# Patient Record
Sex: Male | Born: 1935
Health system: Southern US, Community
[De-identification: ages and names within clinical notes are randomized; demographics above are authoritative.]

## PROBLEM LIST (undated history)

## (undated) DIAGNOSIS — D126 Benign neoplasm of colon, unspecified: Secondary | ICD-10-CM

## (undated) DIAGNOSIS — E230 Hypopituitarism: Secondary | ICD-10-CM

## (undated) DIAGNOSIS — K219 Gastro-esophageal reflux disease without esophagitis: Secondary | ICD-10-CM

## (undated) DIAGNOSIS — I10 Essential (primary) hypertension: Secondary | ICD-10-CM

## (undated) DIAGNOSIS — Z8719 Personal history of other diseases of the digestive system: Secondary | ICD-10-CM

## (undated) DIAGNOSIS — E039 Hypothyroidism, unspecified: Secondary | ICD-10-CM

## (undated) DIAGNOSIS — A0472 Enterocolitis due to Clostridium difficile, not specified as recurrent: Secondary | ICD-10-CM

## (undated) DIAGNOSIS — A048 Other specified bacterial intestinal infections: Secondary | ICD-10-CM

## (undated) DIAGNOSIS — Z8619 Personal history of other infectious and parasitic diseases: Secondary | ICD-10-CM

## (undated) DIAGNOSIS — H5347 Heteronymous bilateral field defects: Secondary | ICD-10-CM

## (undated) DIAGNOSIS — Z789 Other specified health status: Secondary | ICD-10-CM

## (undated) DIAGNOSIS — D369 Benign neoplasm, unspecified site: Secondary | ICD-10-CM

## (undated) HISTORY — DX: Other specified health status: Z78.9

## (undated) HISTORY — DX: Enterocolitis due to Clostridium difficile, not specified as recurrent: A04.72

## (undated) HISTORY — DX: Essential (primary) hypertension: I10

## (undated) HISTORY — DX: Gastro-esophageal reflux disease without esophagitis: K21.9

## (undated) HISTORY — DX: Benign neoplasm, unspecified site: D36.9

## (undated) HISTORY — PX: OTHER SURGICAL HISTORY: SHX169

## (undated) HISTORY — DX: Hypopituitarism: E23.0

## (undated) HISTORY — PX: CATARACT EXTRACTION, BILATERAL: SHX1313

## (undated) HISTORY — DX: Personal history of other diseases of the digestive system: Z87.19

## (undated) HISTORY — DX: Benign neoplasm of colon, unspecified: D12.6

## (undated) HISTORY — DX: Heteronymous bilateral field defects: H53.47

## (undated) HISTORY — PX: TONSILLECTOMY: SUR1361

## (undated) HISTORY — DX: Other specified bacterial intestinal infections: A04.8

## (undated) HISTORY — DX: Personal history of other infectious and parasitic diseases: Z86.19

## (undated) HISTORY — DX: Hypothyroidism, unspecified: E03.9

---

## 2004-03-26 DIAGNOSIS — D126 Benign neoplasm of colon, unspecified: Secondary | ICD-10-CM

## 2004-03-26 HISTORY — DX: Benign neoplasm of colon, unspecified: D12.6

## 2004-03-26 HISTORY — PX: COLONOSCOPY: SHX174

## 2005-03-26 HISTORY — PX: HAND SURGERY: SHX662

## 2005-08-14 ENCOUNTER — Ambulatory Visit: Payer: Self-pay | Admitting: Internal Medicine

## 2005-08-27 ENCOUNTER — Ambulatory Visit (HOSPITAL_COMMUNITY): Admission: RE | Admit: 2005-08-27 | Discharge: 2005-08-27 | Payer: Self-pay | Admitting: Internal Medicine

## 2005-09-21 ENCOUNTER — Ambulatory Visit: Payer: Self-pay | Admitting: Internal Medicine

## 2005-09-21 ENCOUNTER — Ambulatory Visit (HOSPITAL_COMMUNITY): Admission: RE | Admit: 2005-09-21 | Discharge: 2005-09-21 | Payer: Self-pay | Admitting: Internal Medicine

## 2005-09-21 HISTORY — PX: ESOPHAGOGASTRODUODENOSCOPY: SHX1529

## 2005-12-12 ENCOUNTER — Encounter (INDEPENDENT_AMBULATORY_CARE_PROVIDER_SITE_OTHER): Payer: Self-pay | Admitting: *Deleted

## 2005-12-12 ENCOUNTER — Ambulatory Visit (HOSPITAL_BASED_OUTPATIENT_CLINIC_OR_DEPARTMENT_OTHER): Admission: RE | Admit: 2005-12-12 | Discharge: 2005-12-12 | Payer: Self-pay | Admitting: Orthopedic Surgery

## 2009-10-02 ENCOUNTER — Inpatient Hospital Stay (HOSPITAL_COMMUNITY): Admission: EM | Admit: 2009-10-02 | Discharge: 2009-10-05 | Payer: Self-pay | Admitting: Internal Medicine

## 2009-10-02 ENCOUNTER — Ambulatory Visit: Payer: Self-pay | Admitting: Internal Medicine

## 2009-10-04 ENCOUNTER — Ambulatory Visit: Payer: Self-pay | Admitting: Internal Medicine

## 2009-10-13 ENCOUNTER — Encounter: Payer: Self-pay | Admitting: Internal Medicine

## 2009-10-21 ENCOUNTER — Telehealth (INDEPENDENT_AMBULATORY_CARE_PROVIDER_SITE_OTHER): Payer: Self-pay | Admitting: *Deleted

## 2009-10-21 ENCOUNTER — Encounter (INDEPENDENT_AMBULATORY_CARE_PROVIDER_SITE_OTHER): Payer: Self-pay | Admitting: *Deleted

## 2009-10-21 ENCOUNTER — Encounter: Payer: Self-pay | Admitting: Internal Medicine

## 2009-10-31 ENCOUNTER — Ambulatory Visit (HOSPITAL_COMMUNITY): Admission: RE | Admit: 2009-10-31 | Discharge: 2009-10-31 | Payer: Self-pay | Admitting: Internal Medicine

## 2009-11-04 ENCOUNTER — Ambulatory Visit: Payer: Self-pay | Admitting: Internal Medicine

## 2009-11-04 DIAGNOSIS — Z8719 Personal history of other diseases of the digestive system: Secondary | ICD-10-CM

## 2009-11-21 ENCOUNTER — Encounter (INDEPENDENT_AMBULATORY_CARE_PROVIDER_SITE_OTHER): Payer: Self-pay | Admitting: *Deleted

## 2009-11-22 ENCOUNTER — Encounter: Payer: Self-pay | Admitting: Internal Medicine

## 2010-03-26 DIAGNOSIS — D369 Benign neoplasm, unspecified site: Secondary | ICD-10-CM

## 2010-03-26 HISTORY — DX: Benign neoplasm, unspecified site: D36.9

## 2010-03-28 ENCOUNTER — Inpatient Hospital Stay (HOSPITAL_COMMUNITY): Admission: EM | Admit: 2010-03-28 | Discharge: 2010-03-31 | Payer: Self-pay | Source: Home / Self Care

## 2010-03-29 LAB — DIFFERENTIAL
Basophils Absolute: 0 10*3/uL (ref 0.0–0.1)
Basophils Relative: 0 % (ref 0–1)
Eosinophils Absolute: 0 10*3/uL (ref 0.0–0.7)
Eosinophils Relative: 0 % (ref 0–5)
Lymphocytes Relative: 24 % (ref 12–46)
Lymphs Abs: 1 10*3/uL (ref 0.7–4.0)
Monocytes Absolute: 0.2 10*3/uL (ref 0.1–1.0)
Monocytes Relative: 6 % (ref 3–12)
Neutro Abs: 2.8 10*3/uL (ref 1.7–7.7)
Neutrophils Relative %: 68 % (ref 43–77)

## 2010-03-29 LAB — MAGNESIUM: Magnesium: 2.1 mg/dL (ref 1.5–2.5)

## 2010-03-29 LAB — CBC
HCT: 48.5 % (ref 39.0–52.0)
Hemoglobin: 17.1 g/dL — ABNORMAL HIGH (ref 13.0–17.0)
MCH: 31.4 pg (ref 26.0–34.0)
MCHC: 35.3 g/dL (ref 30.0–36.0)
MCV: 89 fL (ref 78.0–100.0)
Platelets: 185 10*3/uL (ref 150–400)
RBC: 5.45 MIL/uL (ref 4.22–5.81)
RDW: 14.1 % (ref 11.5–15.5)
WBC: 4.2 10*3/uL (ref 4.0–10.5)

## 2010-03-29 LAB — T4, FREE: Free T4: 1.24 ng/dL (ref 0.80–1.80)

## 2010-03-29 LAB — TSH: TSH: 0.056 u[IU]/mL — ABNORMAL LOW (ref 0.350–4.500)

## 2010-03-29 LAB — BASIC METABOLIC PANEL
BUN: 29 mg/dL — ABNORMAL HIGH (ref 6–23)
CO2: 23 mEq/L (ref 19–32)
Calcium: 8.4 mg/dL (ref 8.4–10.5)
Chloride: 106 mEq/L (ref 96–112)
Creatinine, Ser: 1.46 mg/dL (ref 0.4–1.5)
GFR calc Af Amer: 57 mL/min — ABNORMAL LOW (ref >60)
GFR calc non Af Amer: 47 mL/min — ABNORMAL LOW (ref >60)
Glucose, Bld: 134 mg/dL — ABNORMAL HIGH (ref 70–99)
Potassium: 4.3 mEq/L (ref 3.5–5.1)
Sodium: 141 mEq/L (ref 135–145)

## 2010-03-29 LAB — T3, FREE: T3, Free: 1.6 pg/mL — ABNORMAL LOW (ref 2.3–4.2)

## 2010-03-29 LAB — PHOSPHORUS: Phosphorus: 3.7 mg/dL (ref 2.3–4.6)

## 2010-03-30 LAB — HEMOCCULT GUIAC POC 1CARD (OFFICE): Fecal Occult Bld: NEGATIVE

## 2010-03-30 LAB — BASIC METABOLIC PANEL
BUN: 26 mg/dL — ABNORMAL HIGH (ref 6–23)
CO2: 24 mEq/L (ref 19–32)
Calcium: 8.1 mg/dL — ABNORMAL LOW (ref 8.4–10.5)
Chloride: 109 mEq/L (ref 96–112)
Creatinine, Ser: 1.4 mg/dL (ref 0.4–1.5)
GFR calc Af Amer: 60 mL/min — ABNORMAL LOW (ref >60)
GFR calc non Af Amer: 49 mL/min — ABNORMAL LOW (ref >60)
Glucose, Bld: 109 mg/dL — ABNORMAL HIGH (ref 70–99)
Potassium: 4.3 mEq/L (ref 3.5–5.1)
Sodium: 141 mEq/L (ref 135–145)

## 2010-03-30 LAB — CBC
HCT: 42.4 % (ref 39.0–52.0)
Hemoglobin: 14.4 g/dL (ref 13.0–17.0)
MCH: 30.1 pg (ref 26.0–34.0)
MCHC: 34 g/dL (ref 30.0–36.0)
MCV: 88.7 fL (ref 78.0–100.0)
Platelets: 187 10*3/uL (ref 150–400)
RBC: 4.78 MIL/uL (ref 4.22–5.81)
RDW: 13.7 % (ref 11.5–15.5)
WBC: 5.8 10*3/uL (ref 4.0–10.5)

## 2010-03-30 LAB — DIFFERENTIAL
Basophils Absolute: 0 10*3/uL (ref 0.0–0.1)
Basophils Relative: 0 % (ref 0–1)
Eosinophils Absolute: 0 10*3/uL (ref 0.0–0.7)
Eosinophils Relative: 0 % (ref 0–5)
Lymphocytes Relative: 26 % (ref 12–46)
Lymphs Abs: 1.5 10*3/uL (ref 0.7–4.0)
Monocytes Absolute: 0.9 10*3/uL (ref 0.1–1.0)
Monocytes Relative: 16 % — ABNORMAL HIGH (ref 3–12)
Neutro Abs: 3.4 10*3/uL (ref 1.7–7.7)
Neutrophils Relative %: 58 % (ref 43–77)

## 2010-03-31 LAB — BASIC METABOLIC PANEL
BUN: 16 mg/dL (ref 6–23)
CO2: 25 mEq/L (ref 19–32)
Calcium: 7.7 mg/dL — ABNORMAL LOW (ref 8.4–10.5)
Chloride: 113 mEq/L — ABNORMAL HIGH (ref 96–112)
Creatinine, Ser: 1.26 mg/dL (ref 0.4–1.5)
GFR calc Af Amer: >60 mL/min (ref >60)
GFR calc non Af Amer: 56 mL/min — ABNORMAL LOW (ref >60)
Glucose, Bld: 99 mg/dL (ref 70–99)
Potassium: 4 mEq/L (ref 3.5–5.1)
Sodium: 142 mEq/L (ref 135–145)

## 2010-04-10 ENCOUNTER — Encounter: Payer: Self-pay | Admitting: Internal Medicine

## 2010-04-10 ENCOUNTER — Ambulatory Visit
Admission: RE | Admit: 2010-04-10 | Discharge: 2010-04-10 | Payer: Self-pay | Source: Home / Self Care | Attending: Urgent Care | Admitting: Urgent Care

## 2010-04-10 DIAGNOSIS — Z860100 Personal history of colon polyps, unspecified: Secondary | ICD-10-CM | POA: Insufficient documentation

## 2010-04-18 ENCOUNTER — Ambulatory Visit (HOSPITAL_COMMUNITY)
Admission: RE | Admit: 2010-04-18 | Discharge: 2010-04-18 | Payer: Self-pay | Source: Home / Self Care | Attending: Internal Medicine | Admitting: Internal Medicine

## 2010-04-18 HISTORY — PX: COLONOSCOPY: SHX174

## 2010-04-21 NOTE — H&P (Addendum)
[name] [name], [name] [name]               ACCOUNT NO.:  [id number]  MEDICAL RECORD NO.:  [id number]          PATIENT TYPE:  EMS  LOCATION:  ED                            FACILITY:  [facility]  PHYSICIAN:  Richarda Overlie, MD       DATE OF BIRTH:  1932/09/22  DATE OF ADMISSION:  03/28/2010 DATE OF DISCHARGE:  LH                             HISTORY & PHYSICAL   PRIMARY CARE PHYSICIAN:  Dr. Weyman [place].  CHIEF COMPLAINT:  Nausea.  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  The patient has not been feeling well since Friday.  He developed sniffles, a postnasal drip and a cough and some chest congestion which he attributed to a viral upper respiratory tract infection.  On Saturday, the patient was feeling fine.  Late Sunday night the patient started developing some nausea and for the last couple of days has had recurrent episodes of vomiting and has not been able to keep any food down.  The patient felt that he was getting dehydrated and therefore came to the ED.  He recalls that his last episode of small-bowel obstruction in July 2011 was also preceded by viral gastroenteritis.  The patient denies any blood in his stool, black tarry stool.  His vomitus is clear without any bile or blood.  He has gained about 3-4 pounds over the holidays.  He denies any chest pain or any shortness of breath.  He was doing some traveling last week and visited his family in [place].  He had a colonoscopy 5 years ago.  Of note is that he had an extensive evaluation done in August 2011 with an MR enterography that showed no abnormality to explain the patient's recent bowel obstruction.  PAST MEDICAL HISTORY: 1. History of panhypopituitarism status post surgery in  [facility] in January 2005. 2. Prior partial small-bowel obstruction. 3. History of colon polyps removed in 2006. 4. History of hepatitis B with immunity. 5. History of H. pylori gastritis status post treatment. 6. History of Clostridium difficile in the past. 7. Hypothyroidism. 8. Microscopic hematuria. 9. Hypertension.  ALLERGIES:  NO KNOWN DRUG ALLERGIES.  FAMILY HISTORY:  Mother lived until the age of 26, and father lived to the age of 72.  SOCIAL HISTORY:  He is married and has 2 adopted children.  He is a retired Sports administrator.  Does not smoke or drink.  PHYSICAL EXAMINATION:  VITAL SIGNS:  Blood pressure 98/77, pulse 104, respirations 20, temperature 98.0.  GENERAL:  The patient appears to be alert, oriented, comfortable in no acute cardiopulmonary distress. HEENT:  Pupils are equal and reactive.  Extraocular movements are intact. NECK:  Supple.  No JVD. LUNGS:  Clear to auscultation bilaterally.  No wheezes, crackles or rhonchi. CARDIOVASCULAR:  Regular rate and rhythm.  No murmurs, rubs or gallops. ABDOMEN:  Obese, soft, nontender, but distended with  hypoactive bowel sounds.  EXTREMITIES:  Without any cyanosis, clubbing or edema. NEUROLOGIC:  Cranial nerves II-XII appear to be grossly intact. PSYCHIATRIC:  Appropriate mood and affect. SKIN:  Without any skin rashes.  LABORATORIES: 1. Point-of-care markers show a troponin of less than 0.05. 2. B-Met:  Sodium 138, potassium 4.2, chloride 98, bicarb 28, glucose     108, BUN 29, creatinine 1.67, calcium 9.5. 3. CBC:  WBC 5.5, hemoglobin 18.1, hematocrit 52.5, platelet count of     213. 4. CT scan of the abdomen and pelvis shows small-bowel obstruction     transition point in the right abdomen at the level of the ileum. 5. Abdominal KUB shows mild mid to distal small-bowel obstruction but     no free air. Possible left lower lobe nodule.  ASSESSMENT: 1. Partial small-bowel obstruction. 2. History of  panhypopituitarism. 3. Hypertension, currently low blood pressure.  PLAN:  The patient will be admitted to a regular floor tonight.  He will be hydrated with normal saline.  His creatinine is mildly elevated; therefore, I suspect that he has acute renal insufficiency in the setting of a small-bowel obstruction.  Dr. Roetta Sessions will be consulted in the morning.  He has already been notified by the ED physician.  The patient may need a repeat colonoscopy.  We will start him on stress dose hydrocortisone given his history of panhypopituitarism and adrenal insufficiency.  The patient will also be continued on his testosterone and his Synthroid.  The patient is very comfortable now.  We will avoid narcotics as these has made his symptoms worse in the past.  We will use Zofran p.r.n. for nausea.  He is a full code     Richarda Overlie, MD     NA/MEDQ  D:  03/28/2010  T:  03/28/2010  Job:  [id number]  Electronically Signed by Richarda Overlie MD on 04/21/2010 07:22:17 AM

## 2010-04-21 NOTE — Discharge Summary (Addendum)
[name] [name], [name] [name]               ACCOUNT NO.:  [id number]  MEDICAL RECORD NO.:  [id number]          PATIENT TYPE:  INP  LOCATION:  A303                          FACILITY:  [facility]  PHYSICIAN:  Ramiro Harvest, MD    DATE OF BIRTH:  1933/01/30  DATE OF ADMISSION:  03/28/2010 DATE OF DISCHARGE:  01/06/2012LH                         DISCHARGE SUMMARY-REFERRING   GASTROENTEROLOGIST:  Dr. Jena Gauss.  ENDOCRINOLOGIST:  Dr. Frances Nickels at [facility]  DISCHARGE DIAGNOSES: 1. Small-bowel obstruction, resolved. 2. Dehydration, resolved. 3. Acute renal failure, resolved. 4. Hypothyroidism. 5. History of panhypopituitarism status post surgery at [facility] in January of 2005. 6. Prior partial small-bowel obstruction in July of 2011. 7. Hypertension. 8. History of hepatitis B with immunity. 9. History of Helicobacter pylori status post treatment. 10.History of Clostridium difficile in the past. 11.History of multiple colonic adenomas removed by Dr. Marcell Anger in     [place] in 2001 with a negative colonoscopy again in 2006. 12.History of gastroesophageal reflux disease with intermitted     symptoms.  Weekly, he takes Prilosec as needed.  He has had a     normal EGD in 2007. 13.Status post transsphenoidal resection of pituitary adenoma at     [facility]. 14.Status post hand surgery in 2007. 15.Status post tonsillectomy. 16.Status post cataract surgery.  DISCHARGE MEDICATIONS: 1. Zofran 4 mg p.o. q.6 hours p.r.n. 2. Prednisone 60 mg p.o. daily x2 days then 40 mg p.o. daily x2 days     then 20 mg p.o. daily x2 days then 5 mg daily as previously taken. 3. Norvasc 5 mg p.o. daily. 4. Benazepril 20 mg 1-1/2 tablets p.o. daily. 5. MiraLax 17 grams p.o. daily p.r.n. 6. Synthroid 150 mcg p.o. daily. 7. Testosterone cypionate 0.75 mL IM q.2 weeks.  DISPOSITION AND FOLLOWUP:  The patient will be discharged home with followup with Dr. Jena Gauss of  Gastroenterology.  The patient will be called with an appointment to follow up on his small-bowel obstruction and also to schedule the patient for his colonoscopy.  The patient will also need to follow up with his endocrinologist, Dr. Frances Nickels at [facility] on February the 6th as previously scheduled.  On follow up with his gastroenterologist, the patient will need a BMET done to follow up on his electrolytes and renal function.  CONSULTATION:  A GI consultation was done.  The patient was seen in consultation by Dr. Jena Gauss on March 29, 2010.  PROCEDURES PERFORMED: 1. Acute abdominal series was done on March 28, 2010, that showed mid     to distal small-bowel obstruction, no free air, low volumes of     probable bibasilar atelectasis, possible left lower lobe nodule.     Further evaluation with PA and lateral chest x-rays at full     inspiration is recommended when the patient is clinically able. 2. CT of the abdomen and pelvis was done on March 28, 2010, that     showed exam was positive for small-bowel obstruction.  The     transition point is in the right abdomen at the level of the ileum.  Large right renal cyst. 3. Chest x-ray done on March 29, 2010, showed chronic minimal right     basilar atelectasis, stable appearance of small 7 mm nodular     density in lower left mid lung zone.  No acute process is     superimposed. 4. Acute abdominal series done on March 30, 2010, showed persistent     dilatation of small-bowel loops compatible with small-bowel     obstruction.  Loops unchanged in size but fewer in number versus     previous exam. 5. An abdominal x-ray done on March 31, 2010, showed improvement in     small-bowel obstruction pattern.  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  The patient had not been feeling well since the Friday before admission.  He developed sniffles and a postnasal drip and a cough with some chest congestion which he attributed to a viral upper respiratory infection.  On the Saturday prior to admission, the patient was feeling fine.  Late Sunday night, the patient started developing some nausea.  For the last couple of days, he has had recurrent episodes of emesis, has not been able to keep any food down.  The patient felt he was getting dehydrated and presented to the ED.  The patient recalls his last episode of small-bowel obstruction in July of 2011 also presented via viral gastroenteritis.  The patient denies any blood in his stool, no black, tarry stools.  His vomitus is clear without any bile or blood. He has gained about 3-4 pounds over the holidays.  He denies any chest pain or shortness of breath.  He was doing some traveling the week prior to admission visiting his family in [place].  He had a colonoscopy over 5 years ago.  Of note, he has had extensive evaluation done in August of 2011 with MR enterography that showed no abnormalities to explain the patient's recent small-bowel obstruction.  For the rest of admission history and physical, please see H and P dictated by Dr. Susie Cassette, job number [id number].  HOSPITAL COURSE: 1. Small-bowel obstruction.  The patient was admitted with a small-     bowel obstruction as noted per abdominal films.  CT scan which was     done was consistent with small-bowel obstruction.  He was placed on     IV fluids, placed on bowel rest with supportive care.  He was     monitored.  He was also initially placed on stress dose of steroids     secondary to his history of panhypopituitarism and also on     Synthroid.  A GI consultation was done.  The patient was seen in     consultation by Dr. Jena Gauss on March 29, 2010, and it was felt  that     the patient did have a similar scenario in July of 2011, and at     that time, it was felt he was not a surgical candidate.  He     recommended to continue the patient on bowel rest with n.p.o.     except for ice chips and supportive care.  The patient was hydrated     with IV fluids and initially placed on IV Protonix.  The patient     was  monitored.  The patient continued to improve clinically.  He     was starting to pass gas, did not have any further nausea or     emesis.  Serial abdominal films were done that showed some slight     improvement.  His diet was subsequently advanced to clear liquids     which the patient tolerated.  He was followed during the whole     hospitalization by GI.  The patient was monitored.  The patient     improved symptomatically.  He did not have any further abdominal     pain, no nausea, no vomiting.  His diet was subsequently advanced     to a low-residue diet which the patient was able to tolerate.  The     patient did have a small bowel movement during this hospitalization     and was in stable and improved condition.  The patient was able to     tolerate a solid diet with a low-residue diet which was recommended     per GI and able to keep it down.  The patient remained in stable     condition, improved clinically and was discharged home in stable     and improved condition.  The patient will follow up with Dr. Jena Gauss     of Gastroenterology for outpatient colonoscopy and further followup     on this hospitalization.  The patient will be discharged in stable     and improved condition. 2. Dehydration. On admission, the patient was noted to be dehydrated.     The patient was hydrated with IV fluids and was euvolemic by day of     discharge. 3. Acute renal failure.  On admission, the patient was noted to have a     creatinine of 1.67.  It was felt this was secondary to acute renal     failure secondary to prerenal azotemia secondary to GI  losses and     decreased oral intake.  The patient's ACE inhibitor was held.  His     blood pressure medications were held.  He was hydrated with IV     fluids with resolution of his acute renal failure such that by day     of discharge, his creatinine was down to 1.26.  The patient will     need a repeat BMET done as an outpatient to follow up on this. 4. Hypothyroidism.  This remained stable during the hospitalization.     TSH levels which were obtained were low but as to be expected in a     patient with panhypopituitarism.  His TSH was 0.056; his free T3     was 1.6; and his free T4 was 1.24.  The patient remained in stable     condition.  He was maintained on his home regimen of Synthroid, and     he will follow up with his endocrinologist as an outpatient. 5. Panhypopituitarism.  On admission, due to the patient's small-bowel     obstruction, he was placed on stress-dose steroids of Solu-Cortef.     Dose was slowly titrated down.  He will be discharged home on     prednisone 60 mg daily with a prednisone taper back down to his     home dose of 5 mg daily.  The patient is to follow up with his     endocrinologist as previously scheduled. 6. The rest of the patient's chronic medical issues remained stable  throughout the hospitalization, and the patient was discharged in     stable and improved condition.  On day of discharge, vital signs:  Temperature 98.7, pulse 61, respirations 20, blood pressure 129/72, sating 97% on room air.  DISCHARGE LABS:  Sodium 142, potassium 4.0, chloride 113, bicarb 25, glucose 99, BUN 16, creatinine 1.26 and a calcium of 7.7.  FOBT was negative.  CBC:  White count 5.8, hemoglobin 14.4, hematocrit 42.4 and a platelet count of 187 with an ANC of 3.4.  It has been a pleasure taking care of Mr. [name] [name].     Ramiro Harvest, MD     DT/MEDQ  D:  03/31/2010  T:  03/31/2010  Job:  [id number]  cc:   R. Roetta Sessions, M.D. [address] [place]  [address]  Weyman Pedro [address] [address]  Dr. Frances Nickels [facility]  Electronically Signed by Ramiro Harvest MD on 04/21/2010 12:16:57 PM

## 2010-04-25 NOTE — Letter (Signed)
Summary: CTE order  CTE order   Imported By: Minna Merritts 10/21/2009 11:35:12  _____________________________________________________________________  External Attachment:    Type:   Image     Comment:   External Document

## 2010-04-25 NOTE — Letter (Signed)
Summary: copy of Inpatient visit for NUR  copy of Inpatient visit for NUR   Imported By: Minna Merritts 10/13/2009 11:53:22  _____________________________________________________________________  External Attachment:    Type:   Image     Comment:   External Document

## 2010-04-25 NOTE — Progress Notes (Signed)
Summary: Confirmation of pt knowing scheduled for CTE  Phone Note Other Incoming Call back at Home Phone [phone]   Caller: Minna Merritts Reason for Call: Confirm/change Appt Summary of Call: Called Dr. Brunetta Jeans to see if he was aware of scheduled CTE set up on DC from [facility].  He stated he was aware to be at [facility] 10/31/09 @ 9am. RAD still needed an order faxed...has been done and scanned.

## 2010-04-25 NOTE — Medication Information (Signed)
Summary: Tax adviser   Imported By: Rosine Beat 11/21/2009 08:56:11  _____________________________________________________________________  External Attachment:    Type:   Image     Comment:   External Document

## 2010-04-25 NOTE — Consult Note (Signed)
[name], [name]               ACCOUNT NO.:  [id number]  MEDICAL RECORD NO.:  [id number]          PATIENT TYPE:  INP  LOCATION:  A303                          FACILITY:  [facility]  PHYSICIAN:  R. [name] [name], M.D. DATE OF BIRTH:  [birth year]  DATE OF CONSULTATION:  03/29/2010 DATE OF DISCHARGE:                                CONSULTATION   GASTROENTEROLOGIST:  Dr. Jena [name].  REASON FOR CONSULTATION:  Small bowel obstruction.  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  He states that on Friday he got a cold, and over the weekend, actually on Sunday, started developing some nausea accompanied by vomiting as well as increasing abdominal distention which prompted him to come to the emergency room as he felt he was getting dehydrated.  He does have a prior history of admission back in July of this year of a similar type situation where he had a small bowel obstruction that was preceded by what was thought to be upper respiratory issues .  He did have a similar episode in July 2011 of a small bowel obstruction which was preceded by intermittent cough.  He was discharged from that admission on July 13, and he actually underwent an MR arteriography to assess for etiology of obstruction.  It showed no abnormality identified to explain the history of recent bowel obstruction, a large right renal cyst, and questionable component of bladder outlet obstruction.  However, he states he did see Urology who stated that he was fine.  Upon exam this morning he states he feels significantly better.  He denies any nausea or vomiting.  He denies pain.  He states he is passing flatus.  He has been p.o. except for ice chips.  He does report that would like to undergo  an outpatient colonoscopy sometime in the near future as he is due for one.  The last one was done in 2006.  PAST MEDICAL HISTORY: 1. History of panhypopituitarism status post surgery at [facility] in     January 2005. 2. Prior partial small bowel obstruction in July 2011. 3. History of hepatitis B with immunity. 4. History of H. pylori status post treatment. 5. A history of C. difficile in the past. 6. Hypothyroidism. 7. Hypertension. 8. History of multiple colonic adenomas removed by Dr. Cleotis [name] in     [place] in 2001 with a negative colonoscopy again in 2006. 9. History of GERD with intermittent symptoms.  Weekly he takes     Prilosec p.r.n.  He has had normal EGD in 2007.  PAST SURGICAL HISTORY: 1. Transsphenoidal resection of pituitary adenoma at [facility]. 2. Hand surgery in 2007. 3. Tonsillectomy. 4. Cataract surgery.  He has no known drug allergies.  FAMILY HISTORY:  His mom is deceased age 92 with a history of stroke and hypertension.  Father is deceased at the age of 70, history of duodenal ulcers requiring partial gastrectomy.  SOCIAL HISTORY:  He  is married with two adopted children with one of the children an attorney in [place] and the other one a Tax adviser as well in [place].  He is married and retired Sports administrator.  He does not smoke or drink alcohol.  CURRENT MEDICATIONS FOR THIS ADMISSION: 1. Lovenox 40 mg subcu every 24 hours. 2. Mucinex 600 mg p.o. twice a day. 3. Solu-Cortef 50 mg IV every 8 hours. 4. Synthroid 75 mcg p.o. 5. Tylenol 650 mg p.o. every 4 hours as needed. 6. Albuterol 2.5 mg. 7. Robitussin 5 mL p.o. 8. Zofran 4 mg IV as needed for nausea. 9. Desyrel 25 mg p.o. at night.  PHYSICAL EXAMINATION:  VITALS:  BP 119/75, pulse is 72, respirations 20, temp 97.8.  GENERAL:  He is in no apparent distress.  He is alert and oriented.  He is comfortable. HEENT:  Sclerae without icterus.  Conjunctivae clear. NECK:  Supple without any JVD  or thyromegaly.  No masses noted. LUNGS:  Are clear to auscultation bilaterally without any wheezes, rales or rhonchi. CARDIAC:  Regular rate and rhythm, S1, S2 present.  No murmurs or rubs noted. ABDOMEN:  It is obese.  It is soft, nontender, slightly mildly distended with hypoactive bowel sounds, but they are present.  EXTREMITIES: Without any edema.  Now alert and oriented x4. SKIN:  Without any lesions or rashes.  PERTINENT RADIOLOGY FOR THIS ADMISSION:  CT of abdomen and pelvis January 3 shows a small bowel obstruction with transition point in the right abdomen at the level of the ileum as well as the large right renal cyst.  Acute abdominal series:  Mid to distal small bowel obstruction. No free air, possible left lower lobe nodule.  Further evaluation of PA and lateral chest recommended when patient clinically stable.  LABORATORIES FOR THIS ADMISSION:  White blood cell count 5.5, H and H 18.1 and 52.5. Potassium 4.2, sodium 138, BUN 29, and creatinine 1.67. Phosphorus 3.7.  Magnesium 2.1.  ASSESSMENT AND PLAN:  [age]-year-old pleasant male who is retired Sports administrator who presents with a partial small bowel obstruction.  He actually recently had the same similar scenario in July 2011 where surgery was consulted, and it felt like he was not a surgical candidate at this time.  The plan is to keep him n.p.o. except for ice chips.  Continue supportive measures.  Continue to hydrate as needed. He will need outpatient colonoscopy as the last one was in 2006.  Will also start him on Protonix IV until he is able to tolerate p.o. Will continue to follow him clinically.  We would like to thank you for referral of this nice gentleman.    ______________________________ [name] [name], ANP-BC   ______________________________ R. [name] [name], M.D.    AS/MEDQ  D:  03/29/2010  T:  03/29/2010  Job:  [id number]  Electronically Signed by [name] [name]  on 04/12/2010 04:23:56  PM Electronically Signed by Lorrin Goodell M.D. on 04/25/2010 01:36:22 PM

## 2010-04-25 NOTE — Op Note (Signed)
NAME:  [name] [name], [name] [name]               ACCOUNT NO.:  [id number]  MEDICAL RECORD NO.:  [id number]          PATIENT TYPE:  AMB  LOCATION:  DAY                           FACILITY:  [facility]  PHYSICIAN:  R. Roetta Sessions, M.D. DATE OF BIRTH:  1932/09/09  DATE OF PROCEDURE:  04/18/2010 DATE OF DISCHARGE:                              OPERATIVE REPORT   PROCEDURE:  Ileal colonoscopy with snare polypectomy.  INDICATIONS FOR PROCEDURE:  A 77 year old gentleman with a history of colonic adenomas.  He is due for surveillance at this time.  He is not having any lower GI tract symptoms.  Risks, benefits, limitations, alternatives, and imponderables have been reviewed, questions answered. Please see the documentation in the medical record.  PROCEDURE NOTE:  O2 saturation, blood pressure, pulse, respirations were monitored throughout the entirety of the procedure.  CONSCIOUS SEDATION:  Versed 6 mg IV, Demerol 75 mg IV in divided doses.  INSTRUMENT:  Pentax video chip system.  FINDINGS: 1. Digital rectal exam revealed no abnormalities.  Endoscopic     findings:  Prep was good.  Colon:  Colonic mucosa was surveyed from     the rectosigmoid junction through the left transverse right colon     to the appendiceal orifice, ileocecal valve/cecum.  These     structures were well seen and photographed for the record.  A     terminal ileum was then made at 10 cm.  From this level, scope     slowly cautiously and withdrawn.  All previously mentioned mucosal     surfaces were again seen.  The patient had somewhat elongated     redundant colon which required external abdominal pressure changing     the patient's position to reach the cecum.  The patient had two 8     mm sessile polyps in the ascending colon, just distal to the     ileocecal valve, both were hot snared, removed, one was taken out     on the way and the other on the way out.  The patient had scattered     left-sided diverticula and a nonbleeding  cecal AVM.  The remainder     of colonic mucosa appeared unremarkable.  Scope was pulled down to     the rectum where a thorough examination of rectal mucosa including     retroflexed view of the anal verge demonstrated no abnormalities.     The patient tolerated the procedure well.  Cecal withdrawal time 9     minutes.  IMPRESSION: 1. Normal rectum. 2. Tortuous elongated colon, scattered left-sided diverticula, two     ascending colon polyps status post hot snare polypectomy,     nonbleeding, innocent-appearing cecal arteriovenous malformation,     not manipulated.  RECOMMENDATIONS: 1. Diverticulosis, polyp literature provided to Mr. Thelin. 2. Follow up on path. 3. Further recommendations to follow.     Jonathon Bellows, M.D.     RMR/MEDQ  D:  04/18/2010  T:  04/18/2010  Job:  [id number]  cc:   Doreen Beam, MD Fax: [phone]  Electronically Signed by Lorrin Goodell M.D. on 04/25/2010 01:36:25 PM

## 2010-04-25 NOTE — Discharge Summary (Signed)
Summary: dc summary  [name], [name]               ACCOUNT NO.:  [id number]      MEDICAL RECORD NO.:  [id number]          PATIENT TYPE:  INP      LOCATION:  A302                          FACILITY:  [facility]      PHYSICIAN:  R. Roetta Sessions, M.D. DATE OF BIRTH:  05-20-1932      DATE OF ADMISSION:  10/02/2009   DATE OF DISCHARGE:  07/13/2011LH                                  DISCHARGE SUMMARY      PRIMARY GASTROENTEROLOGIST:  [name] Bellows, MD      CONSULTANT:  [name] Heading, MD      PRIMARY CARE PHYSICIAN:  [name] Beam, MD, [name] [name], [name] [name].      ADMISSION AND DISCHARGE DIAGNOSES:   1. Partial small bowel obstruction, resolved.   2. History of panhypopituitarism.   3. Right lung base atelectasis.   4. History of colonic polyps in 2006.   5. History of hepatitis B with immunity.   6. History of Helicobacter pylori gastritis, status post treatment.   7. History of clostridium colitis.   8. Hypothyroidism.   9. Microscopic hematuria.      PROCEDURE:  None.      HISTORY AND PHYSICAL:  See [name] [name] admission note from October 02, 2009   for complete history and physical exam.  Basically, [name] [name] is a 77-   year-old Caucasian male who developed acute abdominal pain, distention,   nausea and vomiting.  He was admitted to [name] [name]'s service   for further evaluation.  He had a CT scan of the abdomen and pelvis with   IV contrast.  There was moderate dilatation of proximal small bowel up   to 5 cm.  There was a transition point within the mid small bowel   without underlying obstructive mass or complicating ischemia.  He also   was noted to have intermittent cough and wheezing.  He underwent a chest   x-ray on October 04, 2009 and was found to have minimal right base   atelectasis.  He was treated with Levaquin which was started on October 03, 2009, and therefore no further antibiotics were added.  He began to   tolerate clear and then full liquid diet.  He did  not require a NG tube.   He recovered well with supportive measures only.  He was seen by Dr.   Lovell [name] throughout the hospitalization, and it was felt that there was   no need for surgical intervention at this time.  He will be given a   regular diet today and if he continues to do well he will be discharged   home.      He was also started on hydrocortisone 75 mg q.8 h. given his history of   panhypopituitarism and his history of a low cortisol.  Yesterday he was   changed to prednisone 30 mg p.o. daily and his baseline dose is 5 mg of   prednisone daily.      DISCHARGE CONDITION:  Stable.  DISPOSITION:  He is discharged to home.      Upon admission, he was found to have a microscopic hematuria.  He did   have a normal PSA, it has been recommended that he have a followup with   Urology consult for this.      DISCHARGE MEDICATIONS:   1. Xopenex inhaler 1-2 puffs q.4-6 hours p.r.n. wheezing.   2. MiraLax 17 g p.o. daily as needed for constipation.   3. Norvasc 5 mg daily.   4. Benazepril 30 mg daily.   5. Levaquin 750 mg daily for 4 more days.   6. Protonix 40 mg daily.   7. Prednisone 25 mg daily to start tomorrow, tapered by 5 mg daily,       down to his baseline of 5 mg daily.      INSTRUCTIONS:   1. He may resume his usual activity as tolerated.   2. He is encouraged to ambulate frequently.   3. He is instructed to follow up with [facility] in [place]       regarding his hematuria.   4. He is instructed to follow up with Dr. Sherril Croon regarding his wheezing.   5. He is to advance the diet as tolerated.  If he has any recurrent       abdominal pain, nausea, or vomiting, he is to call us.   6. We will proceed with setting up a CT enterography to further look       at transition point of small bowel in 1 month and will follow up       after this.               [name] [name], N.P.         ______________________________   R. Roetta Sessions, M.D.

## 2010-04-25 NOTE — Letter (Signed)
Summary: CLINIC NOTE FROM [facility]  CLINIC NOTE FROM [facility]   Imported By: Rexene Alberts 11/22/2009 09:05:39  _____________________________________________________________________  External Attachment:    Type:   Image     Comment:   External Document

## 2010-04-25 NOTE — Assessment & Plan Note (Signed)
Summary: follow up scan-per mm/jbb   Visit Type:  Follow-up Visit Primary Care Provider:  vyas  Chief Complaint:  follow up- doing ok.  History of Present Illness: Followup recent hospitalization for possible small bowel obstruction versus ileus. Acute illness resolved with conservative therapy. He did have microscopic hematuria.   CT findings most consistent for mechanical obstruction, however, no causative lesion found. No prior GI surgery.  History of colonic adenoma. Due for surveillance colonoscopy now. He said he has had some urinary hesitancy symptoms. MR enterography done last week demonstrated no small bowel abnormality, a  simple right renal cyst and a urinary bladder diverticulum along with some trabeculations. He is scheduled see Dr. Cline Cools with [facility] in the near future.  PSA was normal.    Preventive Screening-Counseling & Management  Alcohol-Tobacco     Smoking Status: never  Current Medications (verified): 1)  Omeprazole 20 Mg Cpdr (Omeprazole) .... As Needed 2)  Benazepril Hcl 10 Mg Tabs (Benazepril Hcl) .... 30 Mg Once Daily 3)  Amlodipine Besylate 5 Mg Tabs (Amlodipine Besylate) .... Once Daily 4)  Synthroid 150 Mcg Tabs (Levothyroxine Sodium) .... Once Daily 5)  Prednisone 5 Mg Tabs (Prednisone) .... Once Daily 6)  Testosterone 175mg  .... Q 2 Weeks  Allergies (verified): No Known Drug Allergies  Past History:  Family History: Last updated: 11-24-2009 Father: deceased-98 heart failure Mother: deceased 3- cva Siblings: 2 sisters family hx of stomach cancer- uncle  Social History: Last updated: November 24, 2009 Marital Status: Married Children: 2 Occupation: retired Patient has never smoked.  Alcohol Use - no  Risk Factors: Smoking Status: never (November 24, 2009)  Past Medical History: GERD H. PYLORI GASTRITIS Hypertension Hepatitis B TEMPORAL HEMIANOPSIA CLOSTRIDIUM DIFFICILE COLITIS bowel obstruction  Past Surgical  History: Tonsillectomy BILATERAL Cataract Extraction ADENOMA REMOVED FROM HIS COLON MARCH 2001 pituitary 2005 hand- 2007  Family History: Father: deceased-98 heart failure Mother: deceased 90- cva Siblings: 2 sisters family hx of stomach cancer- uncle  Social History: Marital Status: Married Children: 2 Occupation: retired Patient has never smoked.  Alcohol Use - no Smoking Status:  never  Physical Exam  General:  well-appearing 77 year old gentleman accompanied by his wife Lungs:  clear to auscultation Heart:  regular rate and rhythm without murmur gallop rub Abdomen:  flat positive bowel sounds no bruits abdomen is soft nontender without appreciable mass organomegaly  Impression & Recommendations: Impression:  77 year old gentleman with recent hospitalization for small bowel obstruction versus ileus. MRE findings reassuring. At this point, no further GI evaluation is warranted. Hopefully he will not have any recurrence. Abnormal urinary bladder and recent microscopic hematuria. Urology evaluation is pending.  History of a colonic adenoma. He is due for surveillance this year.  Recommendations: I will send pertinent medical records to Dr. Narda Bonds in anticipation of the patient's consultation.  I told the patient he should have a colonoscopy either here or elsewhere (Dr. Karilyn Cota). Will be glad to help him if he chooses to  have his colonoscopy done here.  We'll plan to see this gentleman back on a p.r.n. basis.  Appended Document: Orders Update    Clinical Lists Changes  Problems: Added new problem of SMALL BOWEL OBSTRUCTION, HX OF (ICD-V12.79) Orders: Added new Service order of Est. Patient Level III (96295) - Signed

## 2010-04-27 NOTE — Assessment & Plan Note (Signed)
Summary: hos fu in one week for small bowel obstruction,consult for tc...   Visit Type:  Follow-up Visit Primary Care Provider:  Dr Sherril Croon  Chief Complaint:  hospital follow up- doing ok.  History of Present Illness: 77 y/o caucasian male here for hospital FU following recent partial SB obstruction and to set up colonoscopy.  Hx multiple colonic adenomas 2001, negative colonscopy 2006 by Dr Cleotis Nipper.  Denies any abd pain.  BM normal every other day or once daily.  Denies any rectal bleeding or melena.  Wt stable.  Appetite ok.   Denies fever or chills.   Previous work-up included an MR enterography abd/pelvis 8/11 with no abnormality identified to explain the patient's history of recent bowel obstruction, no evidence for bowel obstruction on this exam,  large right renal cyst, mild bladder wall trabeculation with posterolateral right bladder diverticulum.  Question a component of bladder outlet obstruction.  He was seen in follow-up by Dr Janifer Adie (urologist) for this.   Current Problems (verified): 1)  Colonic Polyps, Adenomatous, Hx of  (ICD-V12.72) 2)  Small Bowel Obstruction, Hx of  (ICD-V12.79)  Current Medications (verified): 1)  Omeprazole 20 Mg Cpdr (Omeprazole) .... As Needed 2)  Benazepril Hcl 10 Mg Tabs (Benazepril Hcl) .... 30 Mg Once Daily 3)  Amlodipine Besylate 5 Mg Tabs (Amlodipine Besylate) .... Once Daily 4)  Synthroid 150 Mcg Tabs (Levothyroxine Sodium) .... Once Daily 5)  Prednisone 5 Mg Tabs (Prednisone) .... Once Daily 6)  Testosterone 175mg  .... Q 2 Weeks  Allergies (verified): No Known Drug Allergies  Past History:  Past Medical History: GERD w/ intermittant symptoms, prilosec q1- 2 weeks Hx H. PYLORI GASTRITIS s/p treatment hx panhypopituitarism s/p surgery [facility] Dr Frances Nickels Hypertension HYPOTHYROIDISM Hepatitis B with immunity TEMPORAL HEMIANOPSIA CLOSTRIDIUM DIFFICILE COLITIS Hx small bowel obstruction x3 (see HPI) 09/2009, 03/2010 Multiple  colonic adenomas 2001, normal colonoscopy Dr Cleotis Nipper 2006  Past Surgical History: Tonsillectomy BILATERAL Cataract Extraction transsphenoidal resection pituitary adenoma hand- 2007  Family History: Father: deceased-98 heart failure, Hx DU requiring partial gastrectomy Mother: deceased 66- cva, htn Siblings: 2 sisters family hx of stomach cancer- uncle No known family history of colorectal carcinoma or liver cancer.  Social History: Marital Status: Married Children: 2 healthy, 1 attorney in [place], 1 nuclear physisist in [place]  Occupation: retired, Pharmacist, community Patient has never smoked.  Alcohol Use - no Illicit Drug Use - no Drug Use:  no  Review of Systems General:  Denies fever, chills, sweats, anorexia, fatigue, weakness, malaise, weight loss, and sleep disorder. CV:  Denies chest pains, angina, palpitations, syncope, dyspnea on exertion, orthopnea, PND, peripheral edema, and claudication. Resp:  Denies dyspnea at rest, dyspnea with exercise, cough, sputum, wheezing, coughing up blood, and pleurisy. GI:  See HPI; Denies difficulty swallowing, pain on swallowing, jaundice, and fecal incontinence. GU:  Complains of urinary hesitancy; denies urinary burning, blood in urine, urinary frequency, nocturnal urination, and urinary incontinence; some problems w/ flow, he plans to address w/ Dr Janifer Adie. MS:  Denies joint pain / LOM, joint swelling, joint stiffness, joint deformity, low back pain, muscle weakness, muscle cramps, muscle atrophy, leg pain at night, leg pain with exertion, and shoulder pain / LOM hand / wrist pain (CTS). Derm:  Denies rash, itching, dry skin, hives, moles, warts, and unhealing ulcers. Psych:  Denies depression, anxiety, memory loss, suicidal ideation, hallucinations, paranoia, phobia, and confusion. Heme:  Denies bruising, bleeding, and enlarged lymph nodes.  Vital Signs:  Patient profile:   77 year old  male Height:      70 inches Weight:      225  pounds BMI:     32.40 Temp:     98.0 degrees F oral Pulse rate:   76 / minute BP sitting:   132 / 90  (left arm) Cuff size:   regular  Vitals Entered By: Hendricks Limes LPN (April 10, 2010 9:02 AM)  Physical Exam  General:  well-appearing 77 year old gentleman NAD Eyes:  Sclera clear, no icterus. Mouth:  No deformity or lesions, dentition normal. Neck:  Supple; no masses or thyromegaly. Lungs:  Clear throughout to auscultation. Heart:  Regular rate and rhythm; no murmurs, rubs,  or bruits. Abdomen:  Soft, nontender and nondistended. No masses, hepatosplenomegaly or hernias noted. Normal bowel sounds.without guarding and without rebound.   Msk:  Symmetrical with no gross deformities. Normal posture. Pulses:  Normal pulses noted. Neurologic:  Alert and  oriented x4;  grossly normal neurologically. Skin:  Intact without significant lesions or rashes. Cervical Nodes:  No significant cervical adenopathy. Psych:  Alert and cooperative. Normal mood and affect.  Impression & Recommendations:  Problem # 1:  SMALL BOWEL OBSTRUCTION, HX OF (ICD-V12.79) 77 y/o caucasian male w/ recurrent partial small bowel obstructions requiring hospitalizations in the setting of URI's.  Hx panhypopituitarism with steroid support that he increases w/ illness.  No evidence on MRI enteroscopy of stricture, mass or transition point.  He's due for colonoscopy at this time.  Orders: Est. Patient Level III (62130)  Problem # 2:  COLONIC POLYPS, ADENOMATOUS, HX OF (ICD-V12.72) Surveillance colonoscopy to be performed by Dr. Jena Gauss in the near future.  I have discussed risks and benefits which include, but are not limited to, bleeding, infection, perforation, or medication reaction.  The patient agrees with this plan and consent will be obtained.  Patient Instructions: 1)  Pt given records to take to Dr Nonie Hoyer (endocrinologist) at [facility]

## 2010-04-27 NOTE — Letter (Signed)
Summary: [facility] ORDER  [facility] ORDER   Imported By: Ave Filter 04/10/2010 10:26:37  _____________________________________________________________________  External Attachment:    Type:   Image     Comment:   External Document

## 2010-04-28 ENCOUNTER — Encounter (INDEPENDENT_AMBULATORY_CARE_PROVIDER_SITE_OTHER): Payer: Self-pay | Admitting: *Deleted

## 2010-05-01 ENCOUNTER — Encounter: Payer: Self-pay | Admitting: Internal Medicine

## 2010-05-03 NOTE — Miscellaneous (Signed)
Summary: CONSULTATION  Clinical Lists Changes  NAME:  [name] [name], [name] [name]               ACCOUNT NO.:  [id number]      MEDICAL RECORD NO.:  [id number]          PATIENT TYPE:  INP      LOCATION:  A303                          FACILITY:  [facility]      PHYSICIAN:  R. Roetta Sessions, M.D. DATE OF BIRTH:  Jul 16, 1932      DATE OF CONSULTATION:  03/29/2010   DATE OF DISCHARGE:                                    CONSULTATION         GASTROENTEROLOGIST:  Dr. Jena Gauss.      REASON FOR CONSULTATION:  Small bowel obstruction.      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  He states that on Friday he got a cold, and over the weekend,   actually on Sunday, started developing some nausea accompanied by   vomiting as well as increasing abdominal distention which prompted him   to come to the emergency room as he felt he was getting dehydrated.  He   does have a prior history of admission back in July of this year of a   similar type situation where he had a small bowel obstruction that was   preceded by what was thought to be upper respiratory issues .  He did have a similar   episode in July 2011 of a small bowel obstruction which was preceded by   intermittent cough.  He was discharged from that admission on July 13,   and he actually underwent an MR arteriography to assess for etiology of   obstruction.  It showed no abnormality identified to explain the history   of recent bowel obstruction, a large right renal cyst, and questionable   component of bladder outlet obstruction.  However, he states he did see   Urology who stated that he was fine.  Upon exam this morning he states   he feels significantly better.  He denies any nausea or vomiting.  He   denies  pain.  He states he is passing flatus.  He has been p.o. except   for ice chips.  He does report that would like to undergo an outpatient   colonoscopy sometime in the near future as he is due for one.  The last   one was done in 2006.      PAST MEDICAL HISTORY:   1. History of panhypopituitarism status post surgery at [facility] in       January 2005.   2. Prior partial small bowel obstruction in July 2011.   3. History of hepatitis B with immunity.   4. History of H. pylori status post treatment.   5. A history of C. difficile in the past.   6. Hypothyroidism.   7. Hypertension.   8. History of multiple colonic adenomas removed by  Dr. Cleotis Nipper in       [place] in 2001 with a negative colonoscopy again in 2006.   9. History of GERD with intermittent symptoms.  Weekly he takes       Prilosec p.r.n.  He has had normal EGD in 2007.      PAST SURGICAL HISTORY:   1. Transsphenoidal resection of pituitary adenoma at [facility].   2. Hand surgery in 2007.   3. Tonsillectomy.   4. Cataract surgery.      He has no known drug allergies.      FAMILY HISTORY:  His mom is deceased age 36 with a history of stroke and   hypertension.  Father is deceased at the age of 10, history of duodenal   ulcers requiring partial gastrectomy.      SOCIAL HISTORY:  He is married with two adopted children with one of the   children an attorney in [place] and the other one a Tax adviser   as well in [place].  He is married and retired Sports administrator.  He does   not smoke or drink alcohol.      CURRENT MEDICATIONS FOR THIS ADMISSION:   1. Lovenox 40 mg subcu every 24 hours.   2. Mucinex 600 mg p.o. twice a day.   3. Solu-Cortef 50 mg IV every 8 hours.   4. Synthroid 75 mcg p.o.   5. Tylenol 650 mg p.o. every 4 hours as needed.   6. Albuterol 2.5 mg.   7. Robitussin 5 mL p.o.   8. Zofran 4 mg IV as needed for nausea.   9. Desyrel 25 mg p.o. at night.      PHYSICAL EXAMINATION:  VITALS:  BP 119/75,  pulse is 72, respirations 20,   temp 97.8.  GENERAL:  He is in no apparent distress.  He is alert and   oriented.  He is comfortable.   HEENT:  Sclerae without icterus.  Conjunctivae clear.   NECK:  Supple without any JVD or thyromegaly.  No masses noted.   LUNGS:  Are clear to auscultation bilaterally without any wheezes, rales   or rhonchi.   CARDIAC:  Regular rate and rhythm, S1, S2 present.  No murmurs or rubs   noted.   ABDOMEN:  It is obese.  It is soft, nontender, slightly mildly distended   with hypoactive bowel sounds, but they are present.  EXTREMITIES:   Without any edema.  Now alert and oriented x4.   SKIN:  Without any lesions or rashes.      PERTINENT RADIOLOGY FOR THIS ADMISSION:  CT of abdomen and pelvis   January 3 shows a small bowel obstruction with transition point in the   right abdomen at the level of the ileum as well as the large right renal   cyst.  Acute abdominal series:  Mid to distal small bowel obstruction.   No free air, possible left lower lobe nodule.  Further evaluation of PA   and lateral chest recommended when patient clinically stable.      LABORATORIES FOR THIS ADMISSION:  White blood cell count 5.5, H and H   18.1 and 52.5. Potassium 4.2, sodium 138, BUN 29, and creatinine 1.67.   Phosphorus 3.7.  Magnesium 2.1.      ASSESSMENT AND PLAN:  [age]-year-old pleasant male who is retired   Sports administrator who presents with a partial small bowel obstruction.  He   actually recently had the same similar scenario in July 2011 where  surgery was consulted, and it felt like he was not a surgical candidate   at this time.  The plan is to keep him n.p.o. except for ice   chips.  Continue supportive measures.  Continue to hydrate as needed.   He will need outpatient colonoscopy as the last one was in 2006.  Will   also start him on Protonix IV until he is able to tolerate p.o. Will   continue to follow him clinically.  We would like to thank you for    referral of this nice gentleman.            ______________________________   Gerrit Halls, ANP-BC         ______________________________   R. Roetta Sessions, M.D.            AS/MEDQ  D:  03/29/2010  T:  03/29/2010  Job:  [id number]      Electronically Signed by Gerrit Halls  on 04/12/2010 04:23:56 PM   Electronically Signed by Lorrin Goodell M.D. on 04/25/2010 01:36:22 PM

## 2010-05-11 NOTE — Letter (Signed)
Summary: [facility]  [facility]   Imported By: Rexene Alberts 05/01/2010 16:54:33  _____________________________________________________________________  External Attachment:    Type:   Image     Comment:   External Document

## 2010-06-05 LAB — BASIC METABOLIC PANEL
BUN: 29 mg/dL — ABNORMAL HIGH (ref 6–23)
CO2: 28 mEq/L (ref 19–32)
Calcium: 9.5 mg/dL (ref 8.4–10.5)
Chloride: 98 mEq/L (ref 96–112)
Creatinine, Ser: 1.67 mg/dL — ABNORMAL HIGH (ref 0.4–1.5)
GFR calc Af Amer: 49 mL/min — ABNORMAL LOW (ref >60)
GFR calc non Af Amer: 40 mL/min — ABNORMAL LOW (ref >60)
Glucose, Bld: 108 mg/dL — ABNORMAL HIGH (ref 70–99)
Potassium: 4.2 mEq/L (ref 3.5–5.1)
Sodium: 138 mEq/L (ref 135–145)

## 2010-06-05 LAB — DIFFERENTIAL
Basophils Absolute: 0 10*3/uL (ref 0.0–0.1)
Basophils Relative: 0 % (ref 0–1)
Eosinophils Absolute: 0.1 10*3/uL (ref 0.0–0.7)
Eosinophils Relative: 1 % (ref 0–5)
Lymphocytes Relative: 32 % (ref 12–46)
Lymphs Abs: 1.8 10*3/uL (ref 0.7–4.0)
Monocytes Absolute: 1.2 10*3/uL — ABNORMAL HIGH (ref 0.1–1.0)
Monocytes Relative: 21 % — ABNORMAL HIGH (ref 3–12)
Neutro Abs: 2.5 10*3/uL (ref 1.7–7.7)
Neutrophils Relative %: 45 % (ref 43–77)

## 2010-06-05 LAB — CBC
HCT: 52.5 % — ABNORMAL HIGH (ref 39.0–52.0)
Hemoglobin: 18.1 g/dL — ABNORMAL HIGH (ref 13.0–17.0)
MCH: 30.7 pg (ref 26.0–34.0)
MCHC: 34.5 g/dL (ref 30.0–36.0)
MCV: 89.1 fL (ref 78.0–100.0)
Platelets: 213 10*3/uL (ref 150–400)
RBC: 5.89 MIL/uL — ABNORMAL HIGH (ref 4.22–5.81)
RDW: 14 % (ref 11.5–15.5)
WBC: 5.5 10*3/uL (ref 4.0–10.5)

## 2010-06-05 LAB — POCT CARDIAC MARKERS
CKMB, poc: <1 ng/mL — ABNORMAL LOW (ref 1.0–8.0)
Myoglobin, poc: 223 ng/mL (ref 12–200)
Troponin i, poc: <0.05 ng/mL (ref 0.00–0.09)

## 2010-06-05 LAB — GLUCOSE, CAPILLARY: Glucose-Capillary: 106 mg/dL — ABNORMAL HIGH (ref 70–99)

## 2010-06-11 LAB — CBC
MCH: 30.1 pg (ref 26.0–34.0)
MCH: 30.3 pg (ref 26.0–34.0)
MCHC: 33.2 g/dL (ref 30.0–36.0)
MCHC: 33.4 g/dL (ref 30.0–36.0)
MCV: 90.8 fL (ref 78.0–100.0)
Platelets: 161 10*3/uL (ref 150–400)
Platelets: 178 10*3/uL (ref 150–400)
RBC: 5.65 MIL/uL (ref 4.22–5.81)
RDW: 14 % (ref 11.5–15.5)
WBC: 5.6 10*3/uL (ref 4.0–10.5)

## 2010-06-11 LAB — GLUCOSE, CAPILLARY
Glucose-Capillary: 106 mg/dL — ABNORMAL HIGH (ref 70–99)
Glucose-Capillary: 129 mg/dL — ABNORMAL HIGH (ref 70–99)
Glucose-Capillary: 140 mg/dL — ABNORMAL HIGH (ref 70–99)
Glucose-Capillary: 157 mg/dL — ABNORMAL HIGH (ref 70–99)
Glucose-Capillary: 99 mg/dL (ref 70–99)

## 2010-06-11 LAB — URINALYSIS, ROUTINE W REFLEX MICROSCOPIC
Bilirubin Urine: NEGATIVE
Glucose, UA: NEGATIVE mg/dL
Ketones, ur: NEGATIVE mg/dL
Leukocytes, UA: NEGATIVE
Nitrite: NEGATIVE
Specific Gravity, Urine: 1.015 (ref 1.005–1.030)
Urobilinogen, UA: 1 mg/dL (ref 0.0–1.0)

## 2010-06-11 LAB — BASIC METABOLIC PANEL
CO2: 26 mEq/L (ref 19–32)
GFR calc non Af Amer: 48 mL/min — ABNORMAL LOW (ref >60)

## 2010-06-11 LAB — DIFFERENTIAL
Basophils Relative: 0 % (ref 0–1)
Eosinophils Absolute: 0.1 10*3/uL (ref 0.0–0.7)
Neutrophils Relative %: 64 % (ref 43–77)

## 2010-06-11 LAB — COMPREHENSIVE METABOLIC PANEL
Albumin: 3.9 g/dL (ref 3.5–5.2)
Calcium: 9.5 mg/dL (ref 8.4–10.5)
GFR calc Af Amer: 57 mL/min — ABNORMAL LOW (ref >60)
Glucose, Bld: 116 mg/dL — ABNORMAL HIGH (ref 70–99)
Total Bilirubin: 1.1 mg/dL (ref 0.3–1.2)

## 2010-06-11 LAB — URINE MICROSCOPIC-ADD ON

## 2010-06-11 LAB — LIPASE, BLOOD: Lipase: 39 U/L (ref 11–59)

## 2010-06-29 ENCOUNTER — Encounter: Payer: Self-pay | Admitting: Internal Medicine

## 2010-08-01 ENCOUNTER — Encounter: Payer: Self-pay | Admitting: Internal Medicine

## 2010-08-11 NOTE — Op Note (Signed)
NAMEIVERSON, SEES               ACCOUNT NO.:  [id number]   MEDICAL RECORD NO.:  [id number]          PATIENT TYPE:  AMB   LOCATION:  DAY                           FACILITY:  [facility]   PHYSICIAN:  Lionel December, M.D.    DATE OF BIRTH:  Dec 08, 1932   DATE OF PROCEDURE:  09/21/2005  DATE OF DISCHARGE:                                 OPERATIVE REPORT   PROCEDURE:  Esophagogastroduodenoscopy.   ENDOSCOPIST:  Lionel December, M.D.   INDICATIONS FOR PROCEDURE:  Mr. Murnane is a 76 year old Caucasian male with  symptoms of chronic gastroesophageal reflux disease, poorly controlled with  therapy; however, he has not been taking a PPI on a regular basis.  He also  has a history of H. pylori gastritis and has been treated twice in the past.  We did a H. pylori test, which is negative.  Since he has had chronic  symptoms, he is undergoing an EGD to make sure he does not have a large  hernia or a battered esophagus.  The procedure and risks were reviewed with  the patient and an informed consent was obtained.   Medications for conscious sedation , medication for pharyngeal topical  anesthesia, Demerol 50 mg and Versed 5 mg IV.   FINDINGS/DESCRIPTION OF PROCEDURE:  The procedure performed in the endoscopy  suite.  The patient's vital signs and O2 saturation were monitored during  the procedure and remained stable.  The patient was placed in the left  lateral position and the Olympus videoscope was passed without any  difficulty into the esophagus.   ESOPHAGUS:  The mucosa of the esophagus is normal.  The GE junction was at  40 cm from the incisors and no hernia was appreciated.  The mucosa was  normal throughout.   STOMACH:  This was empty and distended very well with insufflation.  Folds  and proximal stomach were normal.  Examination of the mucosa, body, antrum,  pylorus, as well as angularis fundus and cardia were normal.   The duodenum and bulbar mucosa were normal.  The scope was passed into  the  second part of the duodenal mucosa and the folds were normal.  The  scope  was withdrawn.   The patient tolerated the procedure well.   FINAL DIAGNOSES:  Normal esophagogastroduodenoscopy.   RECOMMENDATIONS:  Continue anti-reflux measures and omeprazole at 20 mg p.o.  q.a.m.  If he starts to have break-through symptoms frequently or more than  three times a week, he will give [place] a call.      Lionel December, M.D.  Electronically Signed     NR/MEDQ  D:  09/21/2005  T:  09/21/2005  Job:  [id number]   cc:   Weyman Pedro, M.D.  [place], [place]

## 2010-08-11 NOTE — Op Note (Signed)
[name] [name], [name] [name]               ACCOUNT NO.:  [id number]   MEDICAL RECORD NO.:  [id number]          PATIENT TYPE:  AMB   LOCATION:  DSC                          FACILITY:  [facility]   PHYSICIAN:  Cindee Salt, M.D.       DATE OF BIRTH:  26-Aug-1932   DATE OF PROCEDURE:  12/12/2005  DATE OF DISCHARGE:                                 OPERATIVE REPORT   PREOPERATIVE DIAGNOSIS:  Mass of left palm; Dupuytren's contracture, left  little finger.   POSTOPERATIVE DIAGNOSIS:  Mass of left palm; Dupuytren's contracture, left  little finger.   OPERATION:  Excision of mass, left palm; fasciotomy, left little finger,  palmar fascia.   SURGEON:  Merlyn Lot, M.D.   ASSISTANTCarolyne Fiscal, R.N.   ANESTHESIA:  Forearm-based IV regional.   HISTORY:  The patient is a 77 year old male with a history of a mass in his  left palm.  He thinks that this may be a foreign body granuloma.  It is  directly beneath the skin with significant changes in the skin.  X-rays are  negative.  He also has a palmar cord to his little finger with flexion  deformity of approximately 30 degrees to the ring and little fingers with  the cord primarily to the little finger with extension to the ring.  He is  desirous of removal of the mass from his palm with the possibility of  fasciotomy should there be no infectious process present.   PROCEDURE:  The patient's hand is marked in the preoperative area by both  patient and surgeon.  Questions have been encouraged and answered.   PROCEDURE:  The patient was brought to the operating room where a forearm-  based IV regional anesthetic was carried out without difficulty.  He was  prepped using DuraPrep, supine position, left arm free.  After 3-minute dry  time, he was draped.  An elliptical incision was made about the mass in the  palm and the Langer's lines, carried down through subcutaneous tissue.  The  mass was entirely excised, taking care to protect the underlying  neurovascular  structures.  The entire specimen was sent to pathology with  the tag distally.  The skin was undermined and closed interrupted 5-0 nylon  sutures.  Separate instruments were then obtained.  A transverse incision  was made over the palmar fascia of the little finger ___________ up to the  metacarpophalangeal joint.  These were then spread.  The cord was  identified, and this was transected with a portion removed being sent to  pathology for histology.  This was sent in a separate container.  The finger  was able to be fully straightened with both ring and little fingers by the  removal of the cord to the little finger.  Neurovascular structures were  protected.  The wounds were then closed with interrupted 5-0 nylon sutures.  Sterile compressive dressing and splint was applied.  The  patient tolerated the procedure well and was taken to the recovery room for  observation in satisfactory condition.  He will be discharged home to return  to  the [facility] in one week on Vicodin and a Medrol  Dosepak.           ______________________________  Cindee Salt, M.D.     GK/MEDQ  D:  12/12/2005  T:  12/13/2005  Job:  [id number]

## 2010-08-11 NOTE — H&P (Signed)
NAMESHARROD, [name] [name]               ACCOUNT NO.:  [id number]   MEDICAL RECORD NO.:  [id number]           PATIENT TYPE:   LOCATION:                                 FACILITY:   PHYSICIAN:  Lionel December, M.D.    DATE OF BIRTH:  14-Apr-1932   DATE OF ADMISSION:  08/14/2005  DATE OF DISCHARGE:  LH                                HISTORY & PHYSICAL   PRESENTING COMPLAINT:  Recurrent symptoms of heartburn, regurgitation,  nausea and hoarseness.   HISTORY OF PRESENT ILLNESS:  [name] [name] is a 77 year old Caucasian male patient of  Dr. Magnus Ivan who is here for scheduled visit.  He was last seen in  January2003 for symptoms of GERD and responded to PPI.  At that time he was  also treated for H pylori gastritis.  He states he did well for several  months.  He was retreated with Prevpac in 2004 and once again he did quite  well for 6 months or longer.  Gradually his symptoms are back.  He has  intermittent heartburn, particularly after spicy or fatty meals associated  with nausea, belching as well as bloating.  When he has the symptoms, he  starts Prilosec and he feels much better.  After a few weeks he stops the  medication and does well for awhile until his symptoms relapse.  He wonders  if he should be retreated or tested for recurrent H pylori gastritis.  He  denies vomiting.  His appetite is normal.  His weight has been stable, his  bowels move daily.  He denies melena or rectal bleeding.   He was hospitalized in October2006 for nausea, vomiting and diarrhea and had  ileus probably infectious or toxic.  He had normal upper abdominal  ultrasound. He had a CT which revealed thickening to colon at splenic  flexure but subsequent colonoscopy by Sherilyn Cooter A. Cleotis Nipper, M.D. was negative.   REVIEW OF THE SYSTEMS:  Is negative for dysphagia, but he does have  hoarseness as well as cough.  He states if he lies flat after a large meal  he could hear peristalsis and belches a lot.   He is on Lotrel question  dose daily, Synthroid 75 mcg daily, testosterone  100 mg every 2 weeks, Prilosec OTC daily p.r.n., and TUMS p.r.n.   PAST MEDICAL HISTORY:  Hypertension.  He had hepatitis B several years ago  when he was a resident at [facility] and he had  uneventful recovery with immunity.  He had 10 mm adenoma removed from his  colon in March2001 and had another cecal polyp coagulated.  No polyps were  found on recent colonoscopy by Dr. Cleotis Nipper in October2006.  He had  transsphenoidal resection of a large pituitary adenoma in January2005 which  was picked up after he had eye exam revealing by temporal hemianopsia.  This  illness was complicated by ileus with dehydration and few weeks later he was  treated for Clostridium difficile colitis.  Other surgeries include  tonsillectomy and bilateral cataract extraction.  He is due for a CTS  test  by  his endocrinologist, [facility].  Apparently his cortisol level has been  borderline and he has been advised to be treated with hydrocortisone should  he get sick.   ALLERGIES:  No known drug allergies.   FAMILY HISTORY:  Is noncontributory.  Mother lived to be 55 and father lived  to be 93.   SOCIAL HISTORY:  He is married.  He has two adopted children.  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  He does not smoke cigarettes or drink alcohol.   PHYSICAL EXAMINATION:  VITAL SIGNS:  Checked weight 234-1/2 pounds.  He is 5  feet 10 inches tall.  Pulse 64 per minute, blood pressure 132/68,  temperature is 90, 8.1.  HEENT:  Conjunctivae is pink.  Sclerae is nonicteric.  Oropharyngeal mucosa  is normal.  No neck masses or thyromegaly noted.  HEART:  Cardiac exam with regular rhythm.  Normal S1-S3.  No murmur or  gallop noted.  LUNGS:  Clear to auscultation.  ABDOMEN:  Abdomen is full, soft and nontender without organomegaly or  masses.  RECTAL:  Rectal examination deferred.  EXTREMITIES:  No peripheral edema or clubbing noted.   ASSESSMENT:   [name] [name] is a 77 year old Caucasian male who has recurrent symptoms  of gastroesophageal reflux disease.  He has both typical and atypical  symptoms.  It may be related to borderline adrenal cortical function.  Or  simply just has GERD and needs to be on chronic PPI therapy.  Since he is  concerned about recurrence of H pylori infection, it would be a good time  for [place] to document the infection has been eradicated. However if breath test  is positive he needs to be retreated with Helidac rather than Prevpac.   RECOMMENDATIONS:  He will continue to hold off his Prilosec for another week  after which he will have H pylori breath test via an [facility] lab.  After he has  undergone breath test, he can start Prilosec 20 mg p.o. q.a.m. prescription  given for June 2008, preparation 30 with 11 refills.   I will also recommend esophagogastroduodenoscopy which would be delayed  until he returns from his trip to the [facility].      Lionel December, M.D.  Electronically Signed     NR/MEDQ  D:  08/14/2005  T:  08/15/2005  Job:  [id number]

## 2011-03-27 HISTORY — PX: LAPAROTOMY: SHX154

## 2011-12-31 IMAGING — CR DG CHEST 2V
2 series · 2 of 2 positions shown · non-contrast
Comparison: None.

CLINICAL DATA: Abdominal pain, nausea, vomiting

CHEST - 2 VIEW

[view not recorded (1 of 2)]
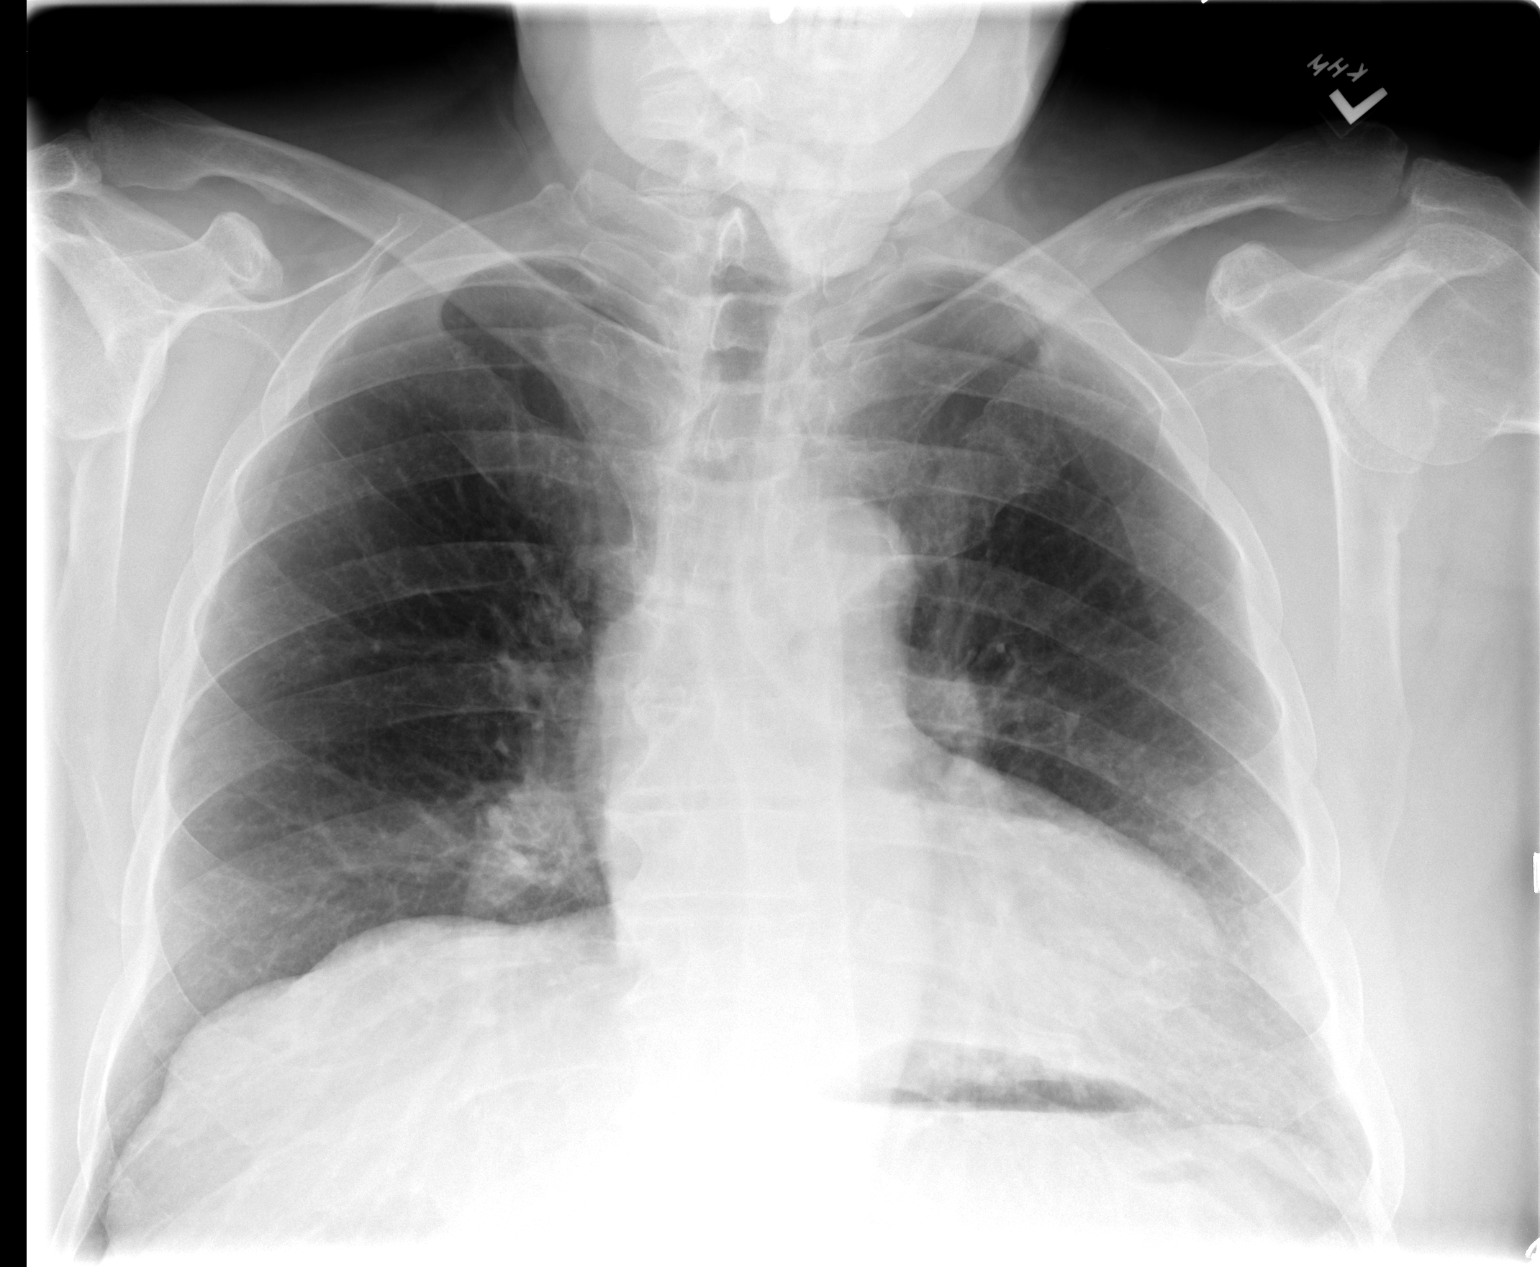

[view not recorded (2 of 2)]
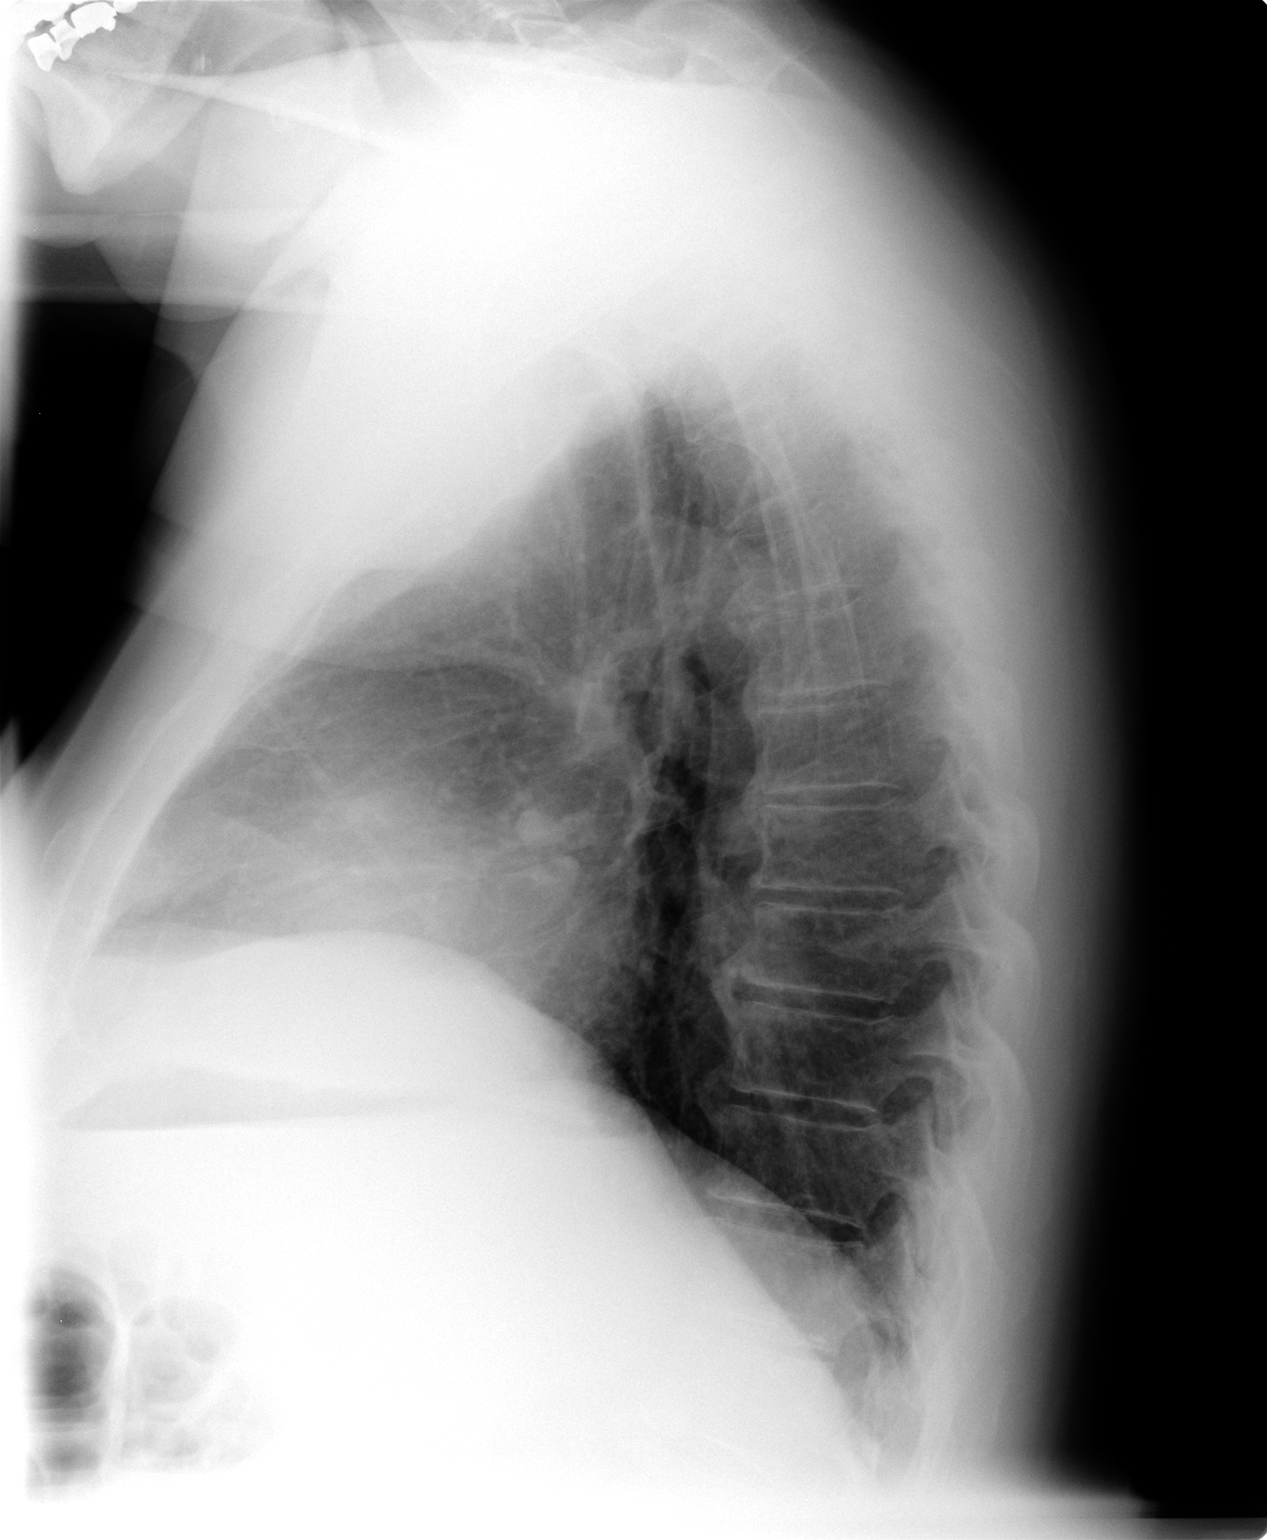

[2 of 2 positions shown; findings below may reference images not displayed]

FINDINGS: Low lung volumes with diffuse interstitial prominence.
No definite pneumonia, significant edema, consolidation, effusion
or pneumothorax.  Degenerative thoracic spine.
IMPRESSION: Low volume exam with interstitial prominence.  No acute process.

## 2012-06-25 IMAGING — CR DG ABDOMEN ACUTE W/ 1V CHEST
5 series · 5 of 5 positions shown · non-contrast
Comparison: CT 10/02/2009, chest radiograph 10/02/2009

CLINICAL DATA: Vomiting and abdominal pain

ACUTE ABDOMEN SERIES (ABDOMEN 2 VIEW & CHEST 1 VIEW)

[view not recorded (1 of 5)]
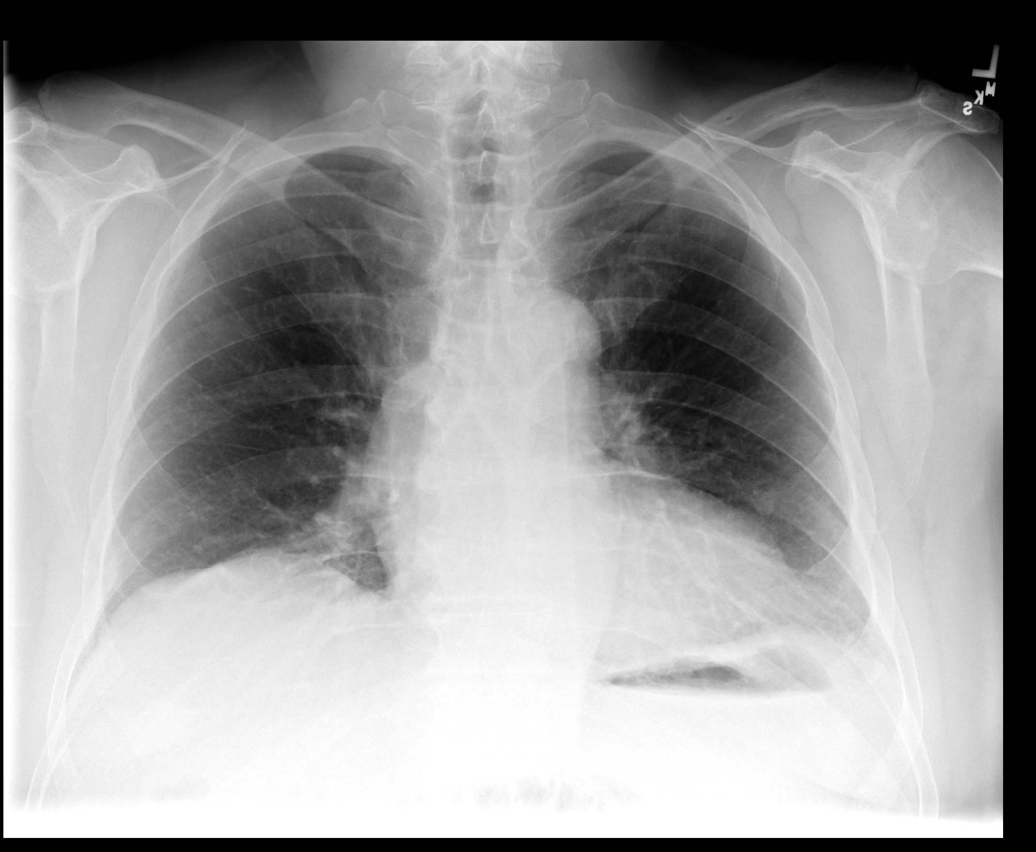

[view not recorded (2 of 5)]
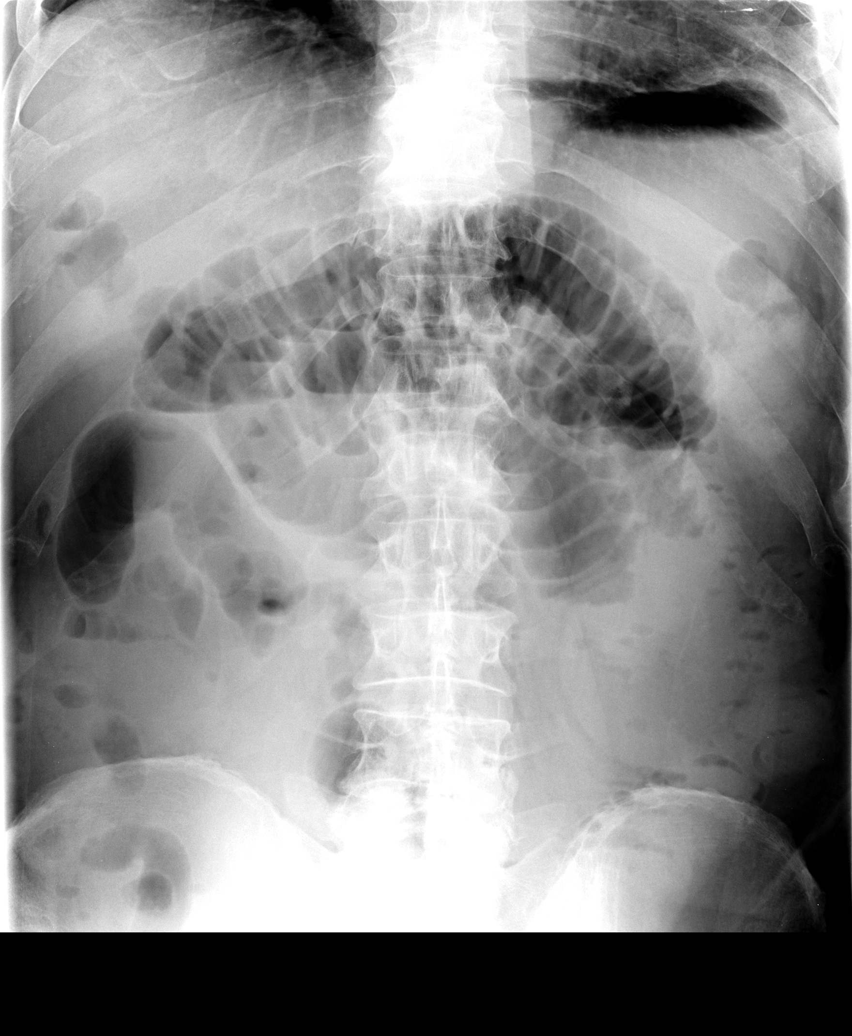

[view not recorded (3 of 5)]
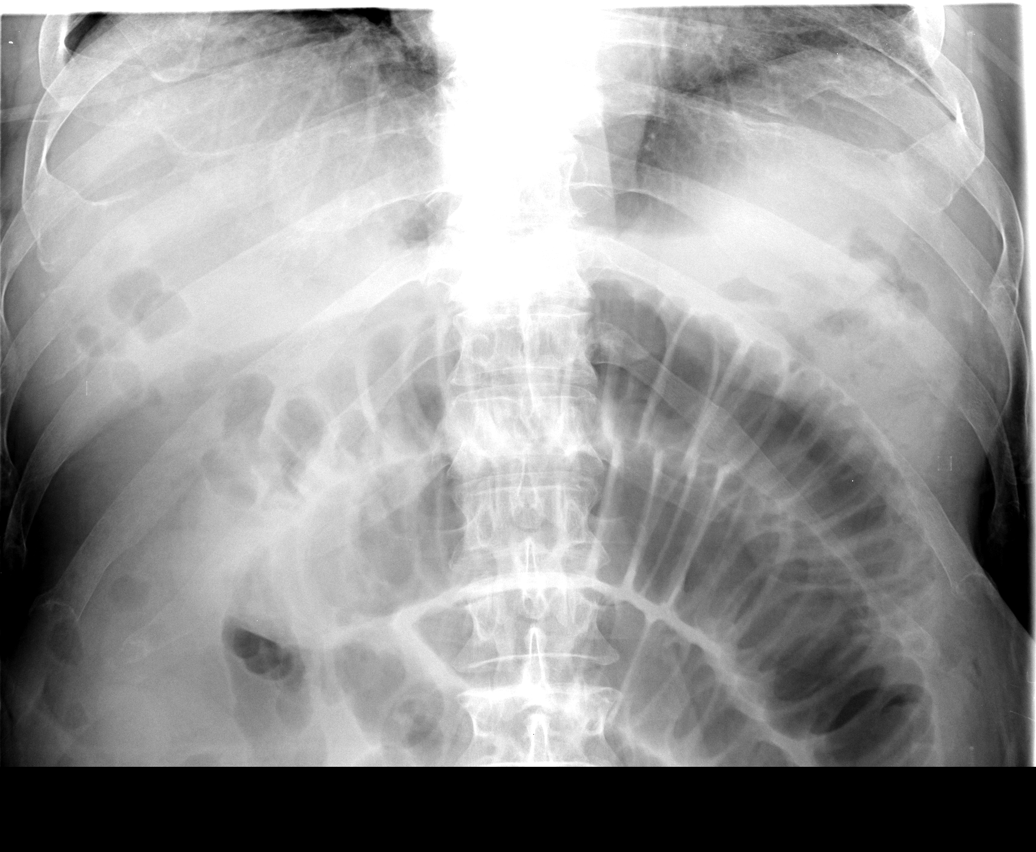

[view not recorded (4 of 5)]
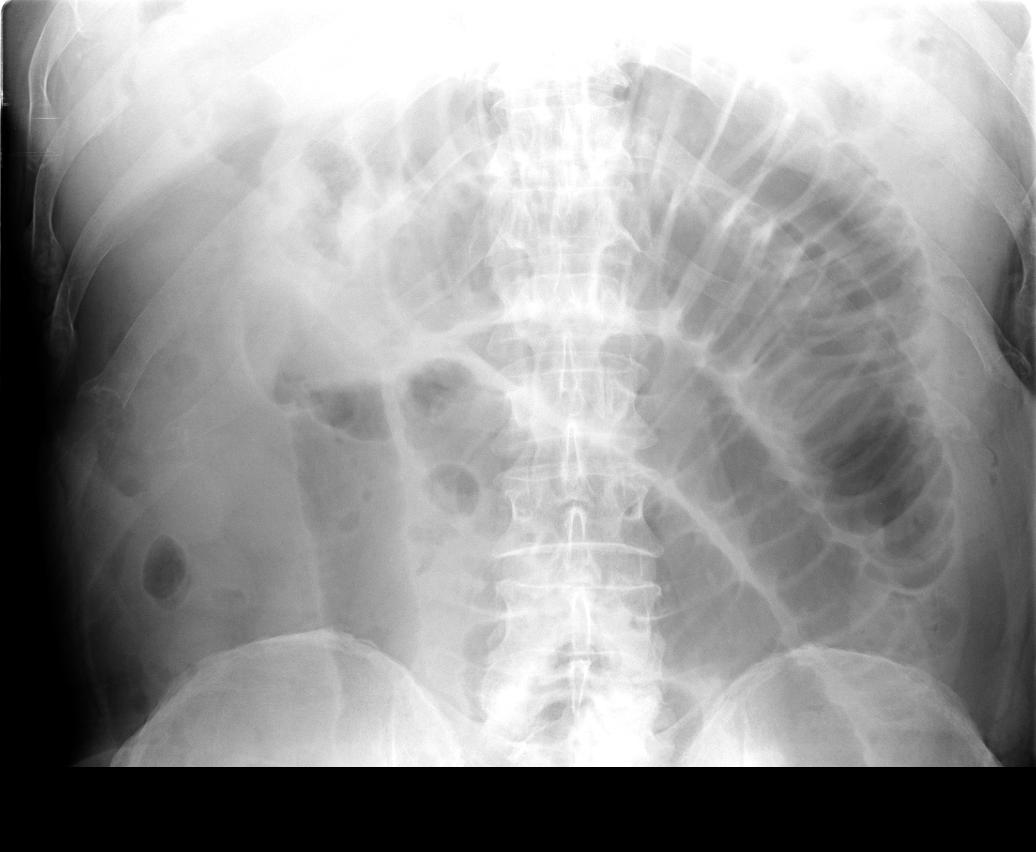

[view not recorded (5 of 5)]
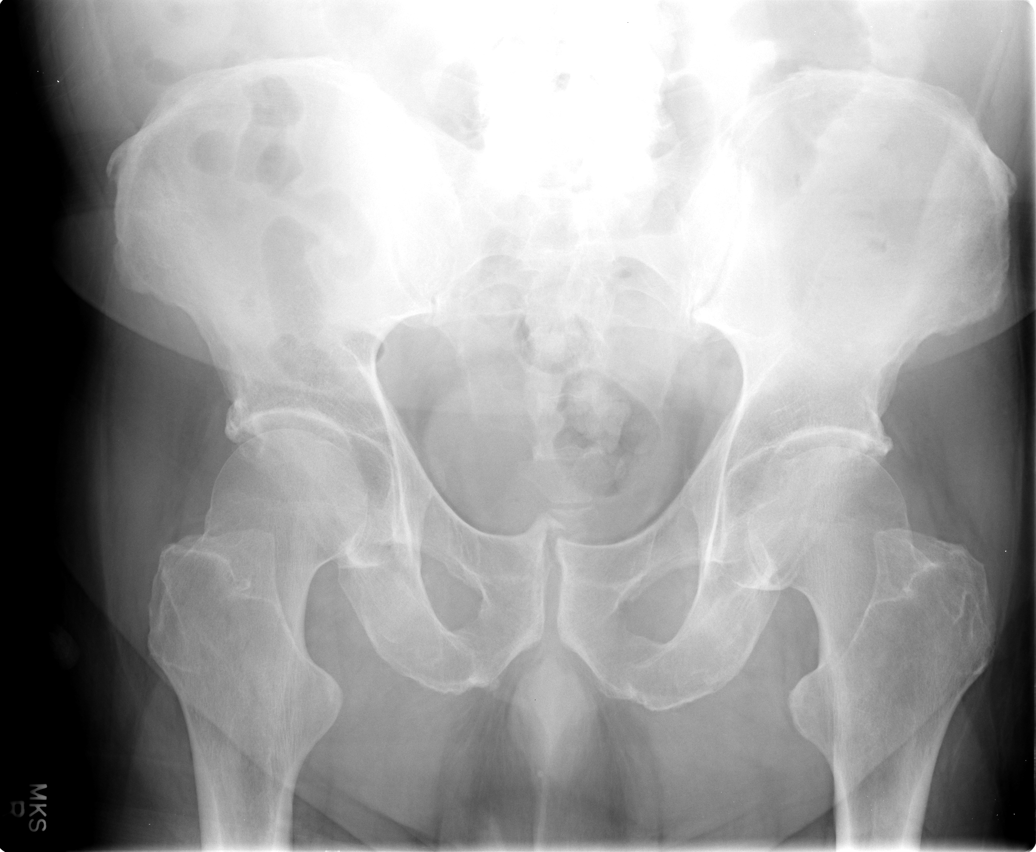

[5 of 5 positions shown; findings below may reference images not displayed]

FINDINGS: Lung volumes are low with crowding of the bronchovascular
markings.  Minimal bibasilar atelectasis noted.  No free air
beneath the diaphragms.  Heart size is normal.  1 cm possible left
lower  lobe nodule is identified overlying the left posterior
seventh rib.  This is not definitely identified on prior exams.

Numerous dilated mid small bowel loops are noted with differential
air-fluid levels.  The colon is decompressed.  No abnormal calcific
opacity.  No acute osseous finding.
IMPRESSION: Mid to distal small bowel obstruction.  No free air.

Low volumes with probable bibasilar atelectasis.

Possible left lower lobe nodule.  Further evaluation with PA and
lateral chest radiographs at full inspiration is recommended when
the patient is clinically able.

## 2012-06-26 IMAGING — CR DG CHEST 2V
2 series · 2 of 2 positions shown · non-contrast
Comparison: 10/04/2009.  10/02/2009.

CLINICAL DATA: History of small-bowel obstruction.  History of
previous lung nodule.

CHEST - 2 VIEW

[view not recorded (1 of 2)]
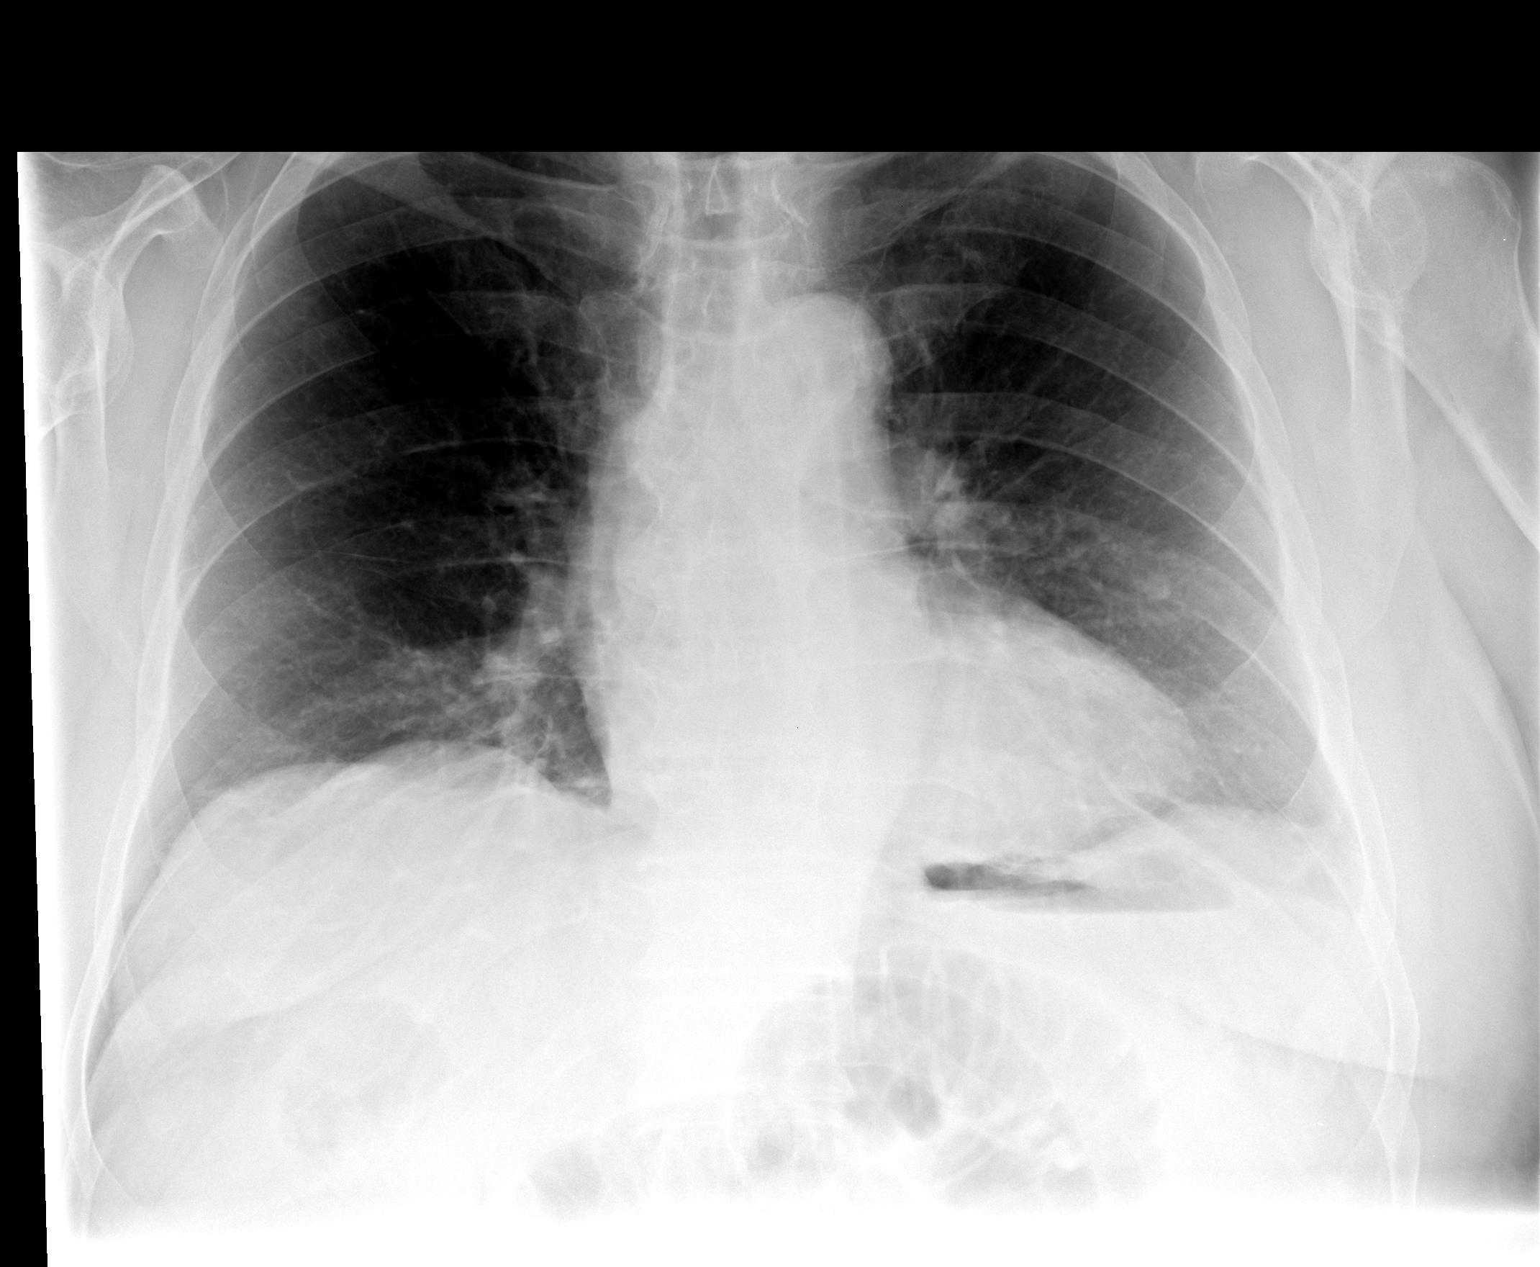

[view not recorded (2 of 2)]
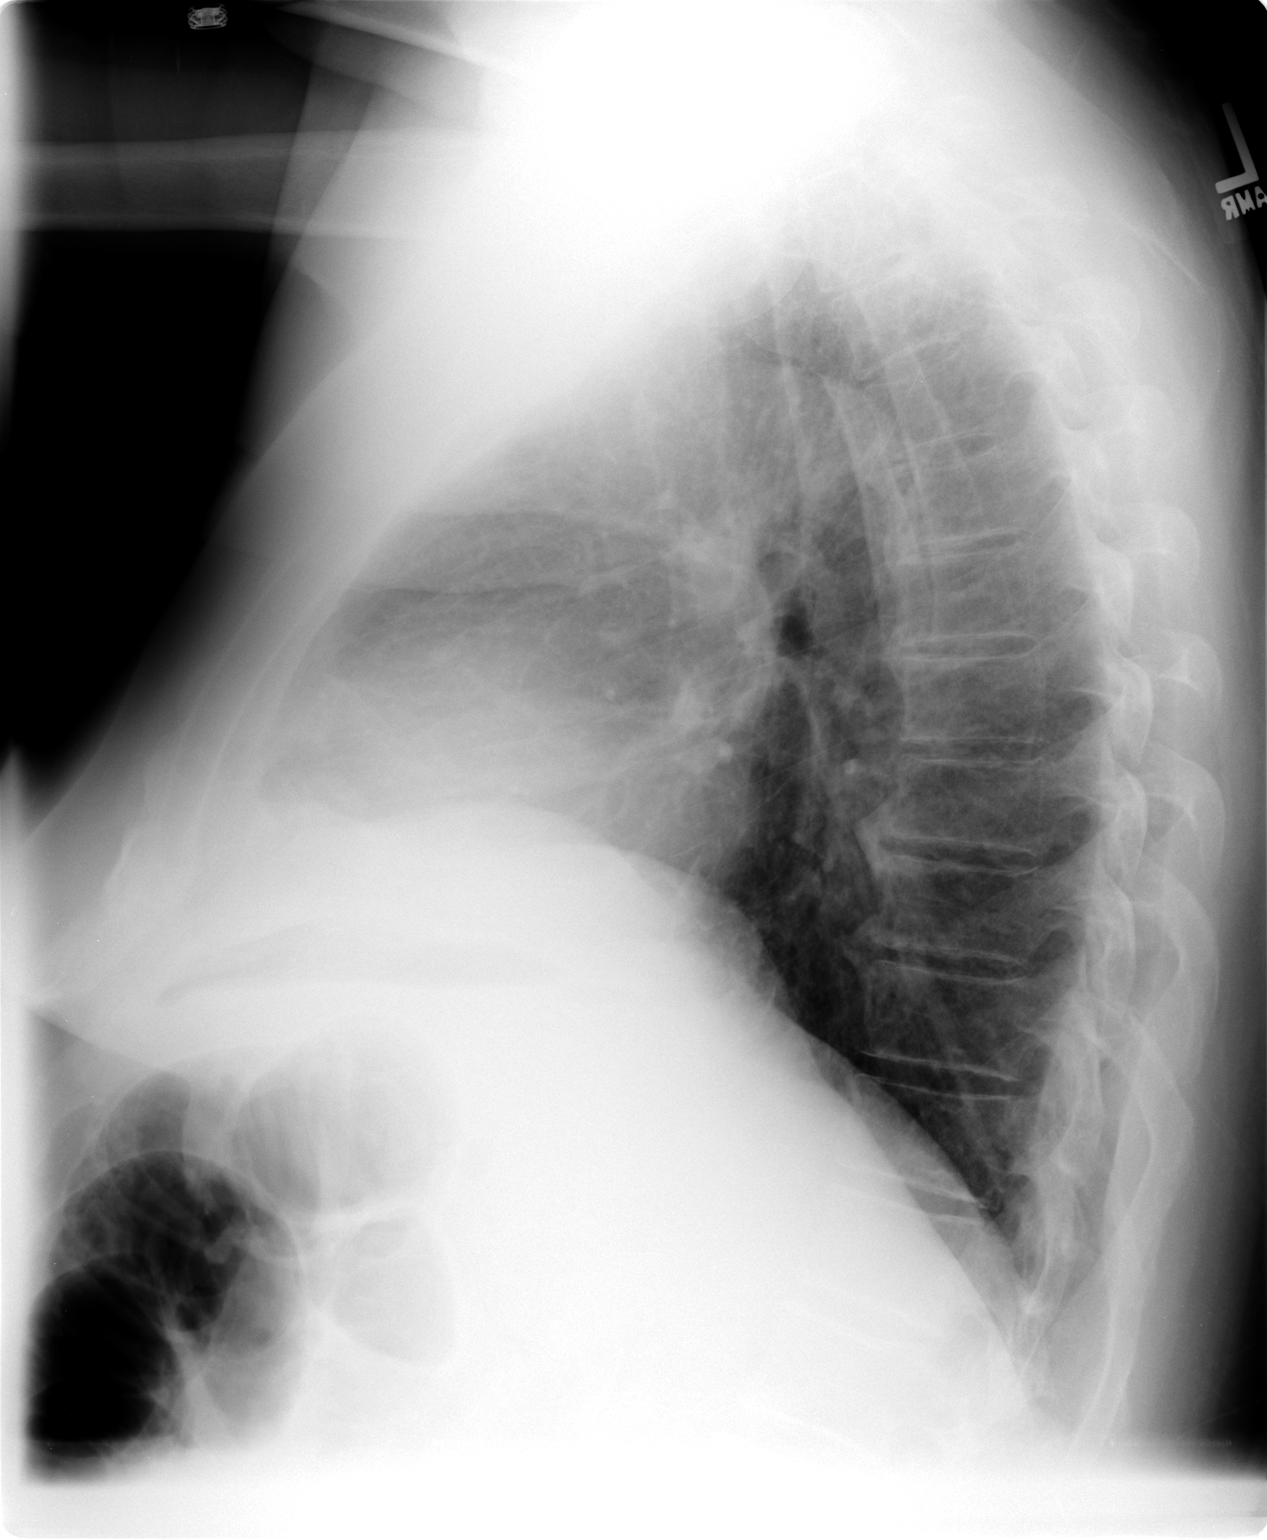

[2 of 2 positions shown; findings below may reference images not displayed]

FINDINGS: The cardiac silhouette is normal size and shape.  There
is ectasia of thoracic aorta with slight tortuosity.  There is
chronic elevation of the right hemidiaphragm with chronic minimal
right medial basilar atelectasis.  No pulmonary edema, pneumonia,
or pleural effusion is seen.  A 7x 7 mm nodular density projects
over the lower midlung on the PA image.  It appears unchanged from
previous chest radiographic examination.
IMPRESSION: Chronic minimal right basilar atelectasis.  Stable
appearance of small 7 mm nodular density and lower left midlung
zone.  No acute process is superimposed.

## 2013-11-24 ENCOUNTER — Ambulatory Visit (INDEPENDENT_AMBULATORY_CARE_PROVIDER_SITE_OTHER): Payer: Medicare Other | Admitting: Internal Medicine

## 2013-11-24 ENCOUNTER — Other Ambulatory Visit: Payer: Self-pay | Admitting: Internal Medicine

## 2013-11-24 ENCOUNTER — Encounter: Payer: Self-pay | Admitting: Internal Medicine

## 2013-11-24 ENCOUNTER — Encounter (INDEPENDENT_AMBULATORY_CARE_PROVIDER_SITE_OTHER): Payer: Self-pay

## 2013-11-24 ENCOUNTER — Ambulatory Visit: Payer: Self-pay | Admitting: Internal Medicine

## 2013-11-24 VITALS — BP 143/79 | HR 56 | Temp 97.1°F | Ht 70.0 in | Wt 213.6 lb

## 2013-11-24 DIAGNOSIS — R6881 Early satiety: Secondary | ICD-10-CM

## 2013-11-24 DIAGNOSIS — K219 Gastro-esophageal reflux disease without esophagitis: Secondary | ICD-10-CM

## 2013-11-24 NOTE — Patient Instructions (Signed)
Schedule Diagnostic EGD - early satiety and GERD  Take BP medications and prednisone the morning of the procedure with small amount of water  Further recommendations to follow

## 2013-11-24 NOTE — Progress Notes (Signed)
Primary Care Physician:  Glenda Chroman., MD Primary Gastroenterologist:  Dr. Gala Romney  Pre-Procedure History & Physical: HPI:  [name] [name] is a 80 y.o. male here for evaluation of a several month history of worsening reflux symptoms described as heartburn and early satiety. Patient has a history of small bowel obstruction and underwent lysis of adhesions in [facility] in November 2013. Takes TUMS and Prilosec only after episodes but not chronically. He was admitted with similar symptoms of nausea and vomiting in July of this year-same facility. It was felt he had an ileus or partial small bowel obstruction. He was discharged after improving with conservative management in 3 days. Patient has not had any abdominal pain, nausea or vomiting. He has lost about 12 pounds since he was last seen here in 2012. Colonoscopy in 2012 demonstrated a couple small adenomas removed and (2) innocent AVMs along with diverticulosis. No family history of colorectal neoplasia. Patient denies dysphagia, odynophagia, melena or hematochezia. He denies change in bowel habits. Has one formed bowel movement daily. He tells me Dr. Woody Seller his recent checked his stool for blood and it was negative. Also, underwent a battery of metabolic/endocrionological  Lab work under the direction of his physician at [facility]. He states everything checked out. He has history of pituitary tumor and is status post resection. Secondarily, he has Addison's disease and does take daily prednisone.  Patient's mother and father lived into their 17s.  Last EGD but done by Dr. Laural Golden 2006-reportedly normal. Has been treated for H. pylori x2 in the past.  Past Medical History  Diagnosis Date  . GERD (gastroesophageal reflux disease)   . Personal history of pyloric stenosis     s/p treatment  . Panhypopituitarism     hx/s/p surgery [facility] Dr. Lonna Duval  . Hypertension   . Hypothyroidism   . Hepatitis B immune   . Hemianopsia    temporal  . Clostridium difficile colitis   . History of small bowel obstruction 07/11 / 01/12    x3  . Colon adenomas 2006    normal [facility] Dr. Lindalou Hose  . H. pylori infection     treated x2 by Dr.Rehman  . Adenomatous polyps 2012  . Hx SBO     x3  . H/O Clostridium difficile infection     Past Surgical History  Procedure Laterality Date  . Tonsillectomy    . Cataract extraction, bilateral    . Transsphenoidal resection pituitary adenoma    . Hand surgery  2007  . Esophagogastroduodenoscopy  09/21/2005    Dr.Rehman- normal egd  . Colonoscopy  04/18/2010    Dr.Anaisha Mago- normal rectum, ortuous elongated colon, scattered L sided diverticula, 2 ascending colon polyps, innocent- appearing cecal arteriovenous malformation bx= adenomatous polyps  . Colonoscopy  2006    Dr.Fleishman- negative    Prior to Admission medications   Medication Sig Start Date End Date Taking? Authorizing Provider  amLODipine (NORVASC) 5 MG tablet Take 5 mg by mouth daily.     Yes Historical Provider, MD  benazepril (LOTENSIN) 10 MG tablet Take 30 mg by mouth daily.     Yes Historical Provider, MD  levothyroxine (SYNTHROID, LEVOTHROID) 150 MCG tablet Take 150 mcg by mouth daily.     Yes Historical Provider, MD  omeprazole (PRILOSEC) 20 MG capsule Take 20 mg by mouth as needed.     Yes Historical Provider, MD  predniSONE (DELTASONE) 5 MG tablet Take 5 mg by mouth daily.     Yes  Historical Provider, MD  testosterone cypionate (DEPOTESTOTERONE CYPIONATE) 200 MG/ML injection Inject 100 mg into the muscle. 11/27/11  Yes Historical Provider, MD    Allergies as of 11/24/2013  . (No Known Allergies)    No family history on file.  History   Social History  . Marital Status: Married    Spouse Name: N/A    Number of Children: N/A  . Years of Education: N/A   Occupational History  . Not on file.   Social History Main Topics  . Smoking status: Never Smoker   . Smokeless tobacco: Not on file  . Alcohol Use: No    . Drug Use: No  . Sexual Activity: Not on file   Other Topics Concern  . Not on file   Social History Narrative  . No narrative on file    Review of Systems: See HPI, otherwise negative ROS  Physical Exam: BP 143/79  Pulse 56  Temp(Src) 97.1 F (36.2 C) (Oral)  Ht 5\' 10"  (1.778 m)  Wt 213 lb 9.6 oz (96.888 kg)  BMI 30.65 kg/m2 General:   Alert,  Well-developed, well-nourished, pleasant and cooperative in NAD.  Accompanied by his wife. Skin:  Intact without significant lesions or rashes. Eyes:  Sclera clear, no icterus.   Conjunctiva pink. Ears:  Normal auditory acuity. Nose:  No deformity, discharge,  or lesions. Mouth:  No deformity or lesions. Neck:  Supple; no masses or thyromegaly. No significant cervical adenopathy. Lungs:  Clear throughout to auscultation.   No wheezes, crackles, or rhonchi. No acute distress. Heart:  Regular rate and rhythm; no murmurs, clicks, rubs,  or gallops. Abdomen: Non-distended, normal bowel sounds.  Well healed midline laparotomy scar. Soft and nontender without appreciable mass or hepatosplenomegaly.  Pulses:  Normal pulses noted. Extremities:  Without clubbing or edema.  Impression:   Very pleasant 80 year old gentleman with insidiously worsening GERD symptoms and early satiety over the past couple of months. He was admitted to the hospital over a month ago with nausea vomiting -  picture of a small bowel obstruction versus ileus-resolved. History of small colonic adenomas removed 3 years ago. No need for repeat colonoscopy at this time.  However, because of cresendo GERD and early satiety symptoms, his upper GI tract needs to be evaluated  Recommendations:     Schedule Diagnostic EGD - early satiety and GERD.  The risks, benefits, limitations, alternatives and imponderables have been reviewed with the patient. Potential for esophageal dilation, biopsy, etc. have also been reviewed.  Questions have been answered. All parties agreeable.   Because of the nature of the procedure, he will not need stress dose corticosteroids. However, he should take BP medications and prednisone the morning of the procedure with small amount of water  Further recommendations to follow    Notice: This dictation was prepared with Dragon dictation along with smaller phrase technology. Any transcriptional errors that result from this process are unintentional and may not be corrected upon review.

## 2013-11-25 ENCOUNTER — Encounter (HOSPITAL_COMMUNITY): Payer: Self-pay | Admitting: Pharmacy Technician

## 2013-12-03 ENCOUNTER — Encounter (HOSPITAL_COMMUNITY): Payer: Self-pay

## 2013-12-03 ENCOUNTER — Encounter (HOSPITAL_COMMUNITY)
Admission: RE | Disposition: A | Discharge status: Home / Self Care | Payer: Self-pay | Source: Ambulatory Visit | Attending: Internal Medicine

## 2013-12-03 ENCOUNTER — Ambulatory Visit (HOSPITAL_COMMUNITY)
Admission: RE | Admit: 2013-12-03 | Discharge: 2013-12-03 | Disposition: A | Discharge status: Home / Self Care | Payer: Medicare Other | Source: Ambulatory Visit | Attending: Internal Medicine | Admitting: Internal Medicine

## 2013-12-03 DIAGNOSIS — Z860100 Personal history of colon polyps, unspecified: Secondary | ICD-10-CM | POA: Insufficient documentation

## 2013-12-03 DIAGNOSIS — E2749 Other adrenocortical insufficiency: Secondary | ICD-10-CM | POA: Insufficient documentation

## 2013-12-03 DIAGNOSIS — I10 Essential (primary) hypertension: Secondary | ICD-10-CM | POA: Insufficient documentation

## 2013-12-03 DIAGNOSIS — E039 Hypothyroidism, unspecified: Secondary | ICD-10-CM | POA: Diagnosis not present

## 2013-12-03 DIAGNOSIS — K257 Chronic gastric ulcer without hemorrhage or perforation: Secondary | ICD-10-CM

## 2013-12-03 DIAGNOSIS — IMO0002 Reserved for concepts with insufficient information to code with codable children: Secondary | ICD-10-CM | POA: Insufficient documentation

## 2013-12-03 DIAGNOSIS — K449 Diaphragmatic hernia without obstruction or gangrene: Secondary | ICD-10-CM

## 2013-12-03 DIAGNOSIS — K2100 Gastro-esophageal reflux disease with esophagitis, without bleeding: Secondary | ICD-10-CM | POA: Insufficient documentation

## 2013-12-03 DIAGNOSIS — K219 Gastro-esophageal reflux disease without esophagitis: Secondary | ICD-10-CM

## 2013-12-03 DIAGNOSIS — K2940 Chronic atrophic gastritis without bleeding: Secondary | ICD-10-CM | POA: Insufficient documentation

## 2013-12-03 HISTORY — PX: ESOPHAGOGASTRODUODENOSCOPY: SHX5428

## 2013-12-03 HISTORY — PX: ESOPHAGEAL DILATION: SHX303

## 2013-12-03 SURGERY — EGD (ESOPHAGOGASTRODUODENOSCOPY)
Anesthesia: Moderate Sedation

## 2013-12-03 MED ORDER — STERILE WATER FOR IRRIGATION IR SOLN
Status: DC | PRN
  Administered 2013-12-03: 10:00:00

## 2013-12-03 MED ORDER — MIDAZOLAM HCL 5 MG/5ML IJ SOLN
INTRAMUSCULAR | Status: DC | PRN
  Administered 2013-12-03: 2 mg via INTRAVENOUS
  Administered 2013-12-03: 1 mg via INTRAVENOUS

## 2013-12-03 MED ORDER — LIDOCAINE VISCOUS 2 % MT SOLN
OROMUCOSAL | Status: DC | PRN
  Administered 2013-12-03: 3 mL via OROMUCOSAL

## 2013-12-03 MED ORDER — LIDOCAINE VISCOUS 2 % MT SOLN
OROMUCOSAL | Status: AC
  Filled 2013-12-03: qty 15

## 2013-12-03 MED ORDER — SODIUM CHLORIDE 0.9 % IV SOLN
INTRAVENOUS | Status: DC
  Administered 2013-12-03: 09:00:00 via INTRAVENOUS

## 2013-12-03 MED ORDER — MIDAZOLAM HCL 5 MG/5ML IJ SOLN
INTRAMUSCULAR | Status: AC
  Filled 2013-12-03: qty 10

## 2013-12-03 MED ORDER — MEPERIDINE HCL 100 MG/ML IJ SOLN
INTRAMUSCULAR | Status: AC
  Filled 2013-12-03: qty 2

## 2013-12-03 MED ORDER — MEPERIDINE HCL 100 MG/ML IJ SOLN
INTRAMUSCULAR | Status: DC | PRN
  Administered 2013-12-03: 50 mg via INTRAVENOUS

## 2013-12-03 MED ORDER — ONDANSETRON HCL 4 MG/2ML IJ SOLN
INTRAMUSCULAR | Status: AC
  Filled 2013-12-03: qty 2

## 2013-12-03 MED ORDER — ONDANSETRON HCL 4 MG/2ML IJ SOLN
INTRAMUSCULAR | Status: DC | PRN
  Administered 2013-12-03: 4 mg via INTRAVENOUS

## 2013-12-03 NOTE — Discharge Instructions (Addendum)
EGD Discharge instructions Please read the instructions outlined below and refer to this sheet in the next few weeks. These discharge instructions provide you with general information on caring for yourself after you leave the hospital. Your doctor may also give you specific instructions. While your treatment has been planned according to the most current medical practices available, unavoidable complications occasionally occur. If you have any problems or questions after discharge, please call your doctor. ACTIVITY  You may resume your regular activity but move at a slower pace for the next 24 hours.   Take frequent rest periods for the next 24 hours.   Walking will help expel (get rid of) the air and reduce the bloated feeling in your abdomen.   No driving for 24 hours (because of the anesthesia (medicine) used during the test).   You may shower.   Do not sign any important legal documents or operate any machinery for 24 hours (because of the anesthesia used during the test).  NUTRITION  Drink plenty of fluids.   You may resume your normal diet.   Begin with a light meal and progress to your normal diet.   Avoid alcoholic beverages for 24 hours or as instructed by your caregiver.  MEDICATIONS  You may resume your normal medications unless your caregiver tells you otherwise.  WHAT YOU CAN EXPECT TODAY  You may experience abdominal discomfort such as a feeling of fullness or gas pains.  FOLLOW-UP  Your doctor will discuss the results of your test with you.  SEEK IMMEDIATE MEDICAL ATTENTION IF ANY OF THE FOLLOWING OCCUR:  Excessive nausea (feeling sick to your stomach) and/or vomiting.   Severe abdominal pain and distention (swelling).   Trouble swallowing.   Temperature over 101 F (37.8 C).   Rectal bleeding or vomiting of blood.     GERD information provided  Take Prilosec 20 mg every day  Further recommendations to follow pending review of pathology  report  Office visit in 3 months   Gastroesophageal Reflux Disease, Adult Gastroesophageal reflux disease (GERD) happens when acid from your stomach flows up into the esophagus. When acid comes in contact with the esophagus, the acid causes soreness (inflammation) in the esophagus. Over time, GERD may create small holes (ulcers) in the lining of the esophagus. CAUSES   Increased body weight. This puts pressure on the stomach, making acid rise from the stomach into the esophagus.  Smoking. This increases acid production in the stomach.  Drinking alcohol. This causes decreased pressure in the lower esophageal sphincter (valve or ring of muscle between the esophagus and stomach), allowing acid from the stomach into the esophagus.  Late evening meals and a full stomach. This increases pressure and acid production in the stomach.  A malformed lower esophageal sphincter. Sometimes, no cause is found. SYMPTOMS   Burning pain in the lower part of the mid-chest behind the breastbone and in the mid-stomach area. This may occur twice a week or more often.  Trouble swallowing.  Sore throat.  Dry cough.  Asthma-like symptoms including chest tightness, shortness of breath, or wheezing. DIAGNOSIS  Your caregiver may be able to diagnose GERD based on your symptoms. In some cases, X-rays and other tests may be done to check for complications or to check the condition of your stomach and esophagus. TREATMENT  Your caregiver may recommend over-the-counter or prescription medicines to help decrease acid production. Ask your caregiver before starting or adding any new medicines.  HOME CARE INSTRUCTIONS   Change the  factors that you can control. Ask your caregiver for guidance concerning weight loss, quitting smoking, and alcohol consumption.  Avoid foods and drinks that make your symptoms worse, such as:  Caffeine or alcoholic drinks.  Chocolate.  Peppermint or mint flavorings.  Garlic and  onions.  Spicy foods.  Citrus fruits, such as oranges, lemons, or limes.  Tomato-based foods such as sauce, chili, salsa, and pizza.  Fried and fatty foods.  Avoid lying down for the 3 hours prior to your bedtime or prior to taking a nap.  Eat small, frequent meals instead of large meals.  Wear loose-fitting clothing. Do not wear anything tight around your waist that causes pressure on your stomach.  Raise the head of your bed 6 to 8 inches with wood blocks to help you sleep. Extra pillows will not help.  Only take over-the-counter or prescription medicines for pain, discomfort, or fever as directed by your caregiver.  Do not take aspirin, ibuprofen, or other nonsteroidal anti-inflammatory drugs (NSAIDs). SEEK IMMEDIATE MEDICAL CARE IF:   You have pain in your arms, neck, jaw, teeth, or back.  Your pain increases or changes in intensity or duration.  You develop nausea, vomiting, or sweating (diaphoresis).  You develop shortness of breath, or you faint.  Your vomit is green, yellow, black, or looks like coffee grounds or blood.  Your stool is red, bloody, or black. These symptoms could be signs of other problems, such as heart disease, gastric bleeding, or esophageal bleeding. MAKE SURE YOU:   Understand these instructions.  Will watch your condition.  Will get help right away if you are not doing well or get worse. Document Released: 12/20/2004 Document Revised: 06/04/2011 Document Reviewed: 09/29/2010 [facility] Patient Information 2015 [place], [place]. This information is not intended to replace advice given to you by your health care provider. Make sure you discuss any questions you have with your health care provider.

## 2013-12-03 NOTE — Interval H&P Note (Signed)
History and Physical Interval Note:  12/03/2013 10:10 AM  [name] [name]  has presented today for surgery, with the diagnosis of GERD  The various methods of treatment have been discussed with the patient and family. After consideration of risks, benefits and other options for treatment, the patient has consented to  Procedure(s) with comments: ESOPHAGOGASTRODUODENOSCOPY (EGD) (N/A) - 9:45 ESOPHAGEAL DILATION as a surgical intervention .  The patient's history has been reviewed, patient examined, no change in status, stable for surgery.  I have reviewed the patient's chart and labs.  Questions were answered to the patient's satisfaction.      Early satiety and GERD symptoms seemed to have settled down. Patient does relate intermittent soft dysphagia.  Otherwise, no change. EGD with possible esophageal dilation has been reviewed with the patient today.The risks, benefits, limitations, alternatives and imponderables have been reviewed with the patient. Potential for esophageal dilation, biopsy, etc. have also been reviewed.  Questions have been answered. All parties agreeable.   Manus Rudd

## 2013-12-03 NOTE — Op Note (Signed)
[place], [id number]   ENDOSCOPY PROCEDURE REPORT  PATIENT: [name], [name].  MR#: [id number] BIRTHDATE: Aug 03, 1932 , 81  yrs. old GENDER: Male ENDOSCOPIST: R.  Garfield Cornea, MD FACP FACG REFERRED BY:  Jerene Bears, M.D. PROCEDURE DATE:  12/03/2013 PROCEDURE:     EGD with Venia Minks dilation followed by gastric biopsy  INDICATIONS:     GERD; esophageal dysphagia; early satiety  INFORMED CONSENT:   The risks, benefits, limitations, alternatives and imponderables have been discussed.  The potential for biopsy, esophogeal dilation, etc. have also been reviewed.  Questions have been answered.  All parties agreeable.  Please see the history and physical in the medical record for more information.  MEDICATIONS:     Versed 3 mg IV and Demerol 50 mg IV in divided doses Zofran 4 mg IV. Xylocaine gel orally  DESCRIPTION OF PROCEDURE:   The IB-7048G ([id number])  endoscope was introduced through the mouth and advanced to the second portion of the duodenum without difficulty or limitations.  The mucosal surfaces were surveyed very carefully during advancement of the scope and upon withdrawal.  Retroflexion view of the proximal stomach and esophagogastric junction was performed.      FINDINGS:   tiny distal esophageal erosions within 5 mm of the GE junction. No Barrett's esophagus. No nodularity. Tubular esophagus appeared patent throughout its course. Stomach empty. Small hiatal hernia. Antral erosions. No ulcer or infiltrating process seen. Patent pylorus. Normal first and second portion of the duodenum.  THERAPEUTIC / DIAGNOSTIC MANEUVERS PERFORMED:  A 54 French Maloney dilator was passed to full insertion easily. A look back revealed no apparent complication related to this maneuver. Subsequently, biopsies abnormal gastric mucosa taken for histologic study   COMPLICATIONS:  None  IMPRESSION:   Mild erosive reflux esophagitis. Small hiatal  hernia. Antral erosions-status post gastric biopsy.  RECOMMENDATIONS:   Followup on pathology. GERD information provided. I recommend the patient take Prilosec 20 mg everyday instead of sporaically. Office visit with me in 3 months.    _______________________________ R. Garfield Cornea, MD FACP [facility] eSigned:  R. Garfield Cornea, MD FACP [facility] 12/03/2013 10:35 AM     CC:

## 2013-12-03 NOTE — H&P (View-Only) (Signed)
Primary Care Physician:  Glenda Chroman., MD Primary Gastroenterologist:  Dr. Gala Romney  Pre-Procedure History & Physical: HPI:  [name] [name] is a 80 y.o. male here for evaluation of a several month history of worsening reflux symptoms described as heartburn and early satiety. Patient has a history of small bowel obstruction and underwent lysis of adhesions in [facility] in November 2013. Takes TUMS and Prilosec only after episodes but not chronically. He was admitted with similar symptoms of nausea and vomiting in July of this year-same facility. It was felt he had an ileus or partial small bowel obstruction. He was discharged after improving with conservative management in 3 days. Patient has not had any abdominal pain, nausea or vomiting. He has lost about 12 pounds since he was last seen here in 2012. Colonoscopy in 2012 demonstrated a couple small adenomas removed and (2) innocent AVMs along with diverticulosis. No family history of colorectal neoplasia. Patient denies dysphagia, odynophagia, melena or hematochezia. He denies change in bowel habits. Has one formed bowel movement daily. He tells me Dr. Woody Seller his recent checked his stool for blood and it was negative. Also, underwent a battery of metabolic/endocrionological  Lab work under the direction of his physician at [facility]. He states everything checked out. He has history of pituitary tumor and is status post resection. Secondarily, he has Addison's disease and does take daily prednisone.  Patient's mother and father lived into their 79s.  Last EGD but done by Dr. Laural Golden 2006-reportedly normal. Has been treated for H. pylori x2 in the past.  Past Medical History  Diagnosis Date  . GERD (gastroesophageal reflux disease)   . Personal history of pyloric stenosis     s/p treatment  . Panhypopituitarism     hx/s/p surgery [facility] Dr. Lonna Duval  . Hypertension   . Hypothyroidism   . Hepatitis B immune   . Hemianopsia    temporal  . Clostridium difficile colitis   . History of small bowel obstruction 07/11 / 01/12    x3  . Colon adenomas 2006    normal [facility] Dr. Lindalou Hose  . H. pylori infection     treated x2 by Dr.Rehman  . Adenomatous polyps 2012  . Hx SBO     x3  . H/O Clostridium difficile infection     Past Surgical History  Procedure Laterality Date  . Tonsillectomy    . Cataract extraction, bilateral    . Transsphenoidal resection pituitary adenoma    . Hand surgery  2007  . Esophagogastroduodenoscopy  09/21/2005    Dr.Rehman- normal egd  . Colonoscopy  04/18/2010    Dr.Asna Muldrow- normal rectum, ortuous elongated colon, scattered L sided diverticula, 2 ascending colon polyps, innocent- appearing cecal arteriovenous malformation bx= adenomatous polyps  . Colonoscopy  2006    Dr.Fleishman- negative    Prior to Admission medications   Medication Sig Start Date End Date Taking? Authorizing Provider  amLODipine (NORVASC) 5 MG tablet Take 5 mg by mouth daily.     Yes Historical Provider, MD  benazepril (LOTENSIN) 10 MG tablet Take 30 mg by mouth daily.     Yes Historical Provider, MD  levothyroxine (SYNTHROID, LEVOTHROID) 150 MCG tablet Take 150 mcg by mouth daily.     Yes Historical Provider, MD  omeprazole (PRILOSEC) 20 MG capsule Take 20 mg by mouth as needed.     Yes Historical Provider, MD  predniSONE (DELTASONE) 5 MG tablet Take 5 mg by mouth daily.     Yes  Historical Provider, MD  testosterone cypionate (DEPOTESTOTERONE CYPIONATE) 200 MG/ML injection Inject 100 mg into the muscle. 11/27/11  Yes Historical Provider, MD    Allergies as of 11/24/2013  . (No Known Allergies)    No family history on file.  History   Social History  . Marital Status: Married    Spouse Name: N/A    Number of Children: N/A  . Years of Education: N/A   Occupational History  . Not on file.   Social History Main Topics  . Smoking status: Never Smoker   . Smokeless tobacco: Not on file  . Alcohol Use: No    . Drug Use: No  . Sexual Activity: Not on file   Other Topics Concern  . Not on file   Social History Narrative  . No narrative on file    Review of Systems: See HPI, otherwise negative ROS  Physical Exam: BP 143/79  Pulse 56  Temp(Src) 97.1 F (36.2 C) (Oral)  Ht 5\' 10"  (1.778 m)  Wt 213 lb 9.6 oz (96.888 kg)  BMI 30.65 kg/m2 General:   Alert,  Well-developed, well-nourished, pleasant and cooperative in NAD.  Accompanied by his wife. Skin:  Intact without significant lesions or rashes. Eyes:  Sclera clear, no icterus.   Conjunctiva pink. Ears:  Normal auditory acuity. Nose:  No deformity, discharge,  or lesions. Mouth:  No deformity or lesions. Neck:  Supple; no masses or thyromegaly. No significant cervical adenopathy. Lungs:  Clear throughout to auscultation.   No wheezes, crackles, or rhonchi. No acute distress. Heart:  Regular rate and rhythm; no murmurs, clicks, rubs,  or gallops. Abdomen: Non-distended, normal bowel sounds.  Well healed midline laparotomy scar. Soft and nontender without appreciable mass or hepatosplenomegaly.  Pulses:  Normal pulses noted. Extremities:  Without clubbing or edema.  Impression:   Very pleasant 80 year old gentleman with insidiously worsening GERD symptoms and early satiety over the past couple of months. He was admitted to the hospital over a month ago with nausea vomiting -  picture of a small bowel obstruction versus ileus-resolved. History of small colonic adenomas removed 3 years ago. No need for repeat colonoscopy at this time.  However, because of cresendo GERD and early satiety symptoms, his upper GI tract needs to be evaluated  Recommendations:     Schedule Diagnostic EGD - early satiety and GERD.  The risks, benefits, limitations, alternatives and imponderables have been reviewed with the patient. Potential for esophageal dilation, biopsy, etc. have also been reviewed.  Questions have been answered. All parties agreeable.   Because of the nature of the procedure, he will not need stress dose corticosteroids. However, he should take BP medications and prednisone the morning of the procedure with small amount of water  Further recommendations to follow    Notice: This dictation was prepared with Dragon dictation along with smaller phrase technology. Any transcriptional errors that result from this process are unintentional and may not be corrected upon review.

## 2013-12-07 ENCOUNTER — Encounter (HOSPITAL_COMMUNITY): Payer: Self-pay | Admitting: Internal Medicine

## 2013-12-08 ENCOUNTER — Encounter: Payer: Self-pay | Admitting: Internal Medicine

## 2013-12-09 ENCOUNTER — Telehealth: Payer: Self-pay | Admitting: Internal Medicine

## 2013-12-09 NOTE — Telephone Encounter (Signed)
I called patient yesterday and reviewe pathology regarding recent gastric biopsies. Letter in the mail. Patient states still having some early satiety. Some vague epigastric pain the day before yesterday. I recommend we try three-week course of Dexilant 60 mg daily- can come by the office this morning for free samples. If that does not abolish his symptoms,  he will need further evaluation. Gallbladder remains in situ.

## 2013-12-09 NOTE — Telephone Encounter (Signed)
Dexilant 60 mg #20 at front for pt to pick up.

## 2013-12-15 ENCOUNTER — Ambulatory Visit: Payer: Self-pay | Admitting: Gastroenterology

## 2014-03-04 ENCOUNTER — Encounter: Payer: Self-pay | Admitting: Gastroenterology

## 2014-03-04 ENCOUNTER — Ambulatory Visit (INDEPENDENT_AMBULATORY_CARE_PROVIDER_SITE_OTHER): Payer: Medicare Other | Admitting: Gastroenterology

## 2014-03-04 ENCOUNTER — Telehealth: Payer: Self-pay

## 2014-03-04 VITALS — BP 127/75 | HR 63 | Temp 97.5°F | Ht 70.0 in | Wt 217.2 lb

## 2014-03-04 DIAGNOSIS — Z8719 Personal history of other diseases of the digestive system: Secondary | ICD-10-CM | POA: Diagnosis not present

## 2014-03-04 DIAGNOSIS — K219 Gastro-esophageal reflux disease without esophagitis: Secondary | ICD-10-CM

## 2014-03-04 MED ORDER — OMEPRAZOLE 20 MG PO CPDR: 20.0000 mg | DELAYED_RELEASE_CAPSULE | ORAL | Status: DC | PRN

## 2014-03-04 NOTE — Patient Instructions (Addendum)
1. Continue omeprazole 20mg  once daily as needed. Prescription sent to your pharmacy. 2. If you have progressive difficulty eating, feeling full, abdominal discomfort we could consider evaluating your gallbladder or doing a gastric emptying study.  3. Return to the office in one year or sooner if needed.

## 2014-03-04 NOTE — Telephone Encounter (Signed)
Kristen called from [facility] to confirm how many tablets of Omeprazole daily as needed and per Neil Crouch, PA he can have one tablet daily as needed. Pharmacist is aware. ( this was needed for insurance purposes).

## 2014-03-04 NOTE — Assessment & Plan Note (Signed)
Doing well on omeprazole. New RX provided. H/O SBO as outlined. Complains of some early satiety but manages with diet. Discussed options of gb work-up versus GES but patient feels like he is doing reasonably well. He will call if symptoms progressive and he wants to pursue work up. Otherwise return to office in 1 year to see Dr. Gala Romney, or sooner if needed.

## 2014-03-04 NOTE — Progress Notes (Signed)
cc'ed to pcp °

## 2014-03-04 NOTE — Progress Notes (Signed)
      Primary Care Physician: Glenda Chroman., MD  Primary Gastroenterologist:  Garfield Cornea, MD   Chief Complaint  Patient presents with  . 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 EGD 11/2013 for esophageal dysphagia, GERD, early satiety. He had mild ERE, small hiatal hernia, antral erosions (chronic atrophic gastritis). S/p esophageal dilation without apparent complication or stricture. Colonoscopy in 2012 demonstrated a couple small adenomas removed and (2) innocent AVMs along with diverticulosis. No family history of colorectal neoplasia. H/O SBO previously (8 times, with one laparoscopy for lysis of adhesions in 2013). He has had 2 episodes of SBO since then, the last time in summer 2015.  Weight is up 4 pounds since 11/2013.  Eating small meals due to early satiety. Is able to manage his symptoms with diet. Took Dexilant for three weeks after his EGD and did well. Switched to omeprazole when he ran out of samples and has been doing fine. No vomiting or abdominal pain. "lives in fear" regarding when next episode of obstruction may occur. Wanted to be directly admitted by Dr. Gala Romney if he had another episode. Discussed use of ER/hospitalist service with GI consulting. He gets hypotensive quickly with vomiting in light of hypopituitarism.   BMs regular. No melena, brbpr. No dysphagia.    Current Outpatient Prescriptions  Medication Sig Dispense Refill  . amLODipine (NORVASC) 5 MG tablet Take 5 mg by mouth daily.      . benazepril (LOTENSIN) 10 MG tablet Take 30 mg by mouth daily.      Marland Kitchen levothyroxine (SYNTHROID, LEVOTHROID) 150 MCG tablet Take 150 mcg by mouth daily.      Marland Kitchen omeprazole (PRILOSEC) 20 MG capsule Take 20 mg by mouth as needed.      . predniSONE (DELTASONE) 5 MG tablet Take 5 mg by mouth daily.      Marland Kitchen testosterone cypionate (DEPOTESTOTERONE CYPIONATE) 200 MG/ML injection Inject 100 mg into the muscle.     No current  facility-administered medications for this visit.    Allergies as of 03/04/2014  . (No Known Allergies)    ROS:  General: Negative for anorexia, weight loss, fever, chills, fatigue, weakness. ENT: Negative for hoarseness, difficulty swallowing , nasal congestion. CV: Negative for chest pain, angina, palpitations, dyspnea on exertion, peripheral edema.  Respiratory: Negative for dyspnea at rest, dyspnea on exertion, cough, sputum, wheezing.  GI: See history of present illness. GU:  Negative for dysuria, hematuria, urinary incontinence, urinary frequency, nocturnal urination.  Endo: Negative for unusual weight change.    Physical Examination:   BP 127/75 mmHg  Pulse 63  Temp(Src) 97.5 F (36.4 C)  Ht 5\' 10"  (1.778 m)  Wt 217 lb 3.2 oz (98.521 kg)  BMI 31.16 kg/m2  General: Well-nourished, well-developed in no acute distress.  Eyes: No icterus. Mouth: Oropharyngeal mucosa moist and pink , no lesions erythema or exudate. Lungs: Clear to auscultation bilaterally.  Heart: Regular rate and rhythm, no murmurs rubs or gallops.  Abdomen: Bowel sounds are normal, nontender, nondistended, no hepatosplenomegaly or masses, no abdominal bruits or hernia , no rebound or guarding.   Extremities: No lower extremity edema. No clubbing or deformities. Neuro: Alert and oriented x 4   Skin: Warm and dry, no jaundice.   Psych: Alert and cooperative, normal mood and affect.

## 2014-12-16 ENCOUNTER — Other Ambulatory Visit: Payer: Self-pay | Admitting: Internal Medicine

## 2015-02-09 ENCOUNTER — Encounter: Payer: Self-pay | Admitting: Internal Medicine

## 2015-03-15 ENCOUNTER — Encounter: Payer: Self-pay | Admitting: Internal Medicine

## 2015-03-22 ENCOUNTER — Telehealth: Payer: Self-pay | Admitting: Internal Medicine

## 2015-03-22 NOTE — Telephone Encounter (Signed)
I called patient back and spoke with wife. He was heme positive through his PCP. Let's see if we can get those records.   Patient ONLY wants to see Dr. Gala Romney. Please put him in for Jan 13th at 8:30am. I saw there is an opening there. Wife is aware.  [name], ANP-BC [facility]

## 2015-03-22 NOTE — Telephone Encounter (Signed)
Routing to Anna

## 2015-03-22 NOTE — Telephone Encounter (Signed)
OV MADE and last office notes and labs requested from PCP

## 2015-03-22 NOTE — Telephone Encounter (Signed)
Pt called asking to speak with RMR or his PA. I told him neither were in the office today. 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 I asked if this was regarding a patient or himself and he said it's regarding himself.  I see were the patient is on the recall list and was sent a letter to call to make a OV prior to scheduling his next procedure. Patient is asking for AS to call him when she gets in from rounding and he will be available until 1230. Please call him at [phone].

## 2015-04-08 ENCOUNTER — Other Ambulatory Visit: Payer: Self-pay

## 2015-04-08 ENCOUNTER — Ambulatory Visit (INDEPENDENT_AMBULATORY_CARE_PROVIDER_SITE_OTHER): Payer: Medicare Other | Admitting: Internal Medicine

## 2015-04-08 ENCOUNTER — Encounter: Payer: Self-pay | Admitting: Internal Medicine

## 2015-04-08 VITALS — BP 144/80 | HR 64 | Temp 97.6°F | Ht 70.0 in | Wt 222.8 lb

## 2015-04-08 DIAGNOSIS — K219 Gastro-esophageal reflux disease without esophagitis: Secondary | ICD-10-CM

## 2015-04-08 DIAGNOSIS — Z86010 Personal history of colonic polyps: Secondary | ICD-10-CM

## 2015-04-08 DIAGNOSIS — R195 Other fecal abnormalities: Secondary | ICD-10-CM

## 2015-04-08 MED ORDER — PEG 3350-KCL-NA BICARB-NACL 420 G PO SOLR: 4000.0000 mL | Freq: Once | ORAL | Status: AC

## 2015-04-08 NOTE — Patient Instructions (Signed)
Schedule surveillance / diagnostic colonoscopy - history of colon polyps /hemocult positive stool  Split prep  Take BP medication and prednisone the morning of the procedure  Continue omeprazole daily as needed  GERD information provided

## 2015-04-08 NOTE — Progress Notes (Signed)
Primary Care Physician:  Glenda Chroman., MD Primary Gastroenterologist:  Dr. Gala Romney  Pre-Procedure History & Physical: HPI:  [name] [name] is a 82 y.o. male here for  For GI follow-up. Recently found to be occult blood positive on a mail in card. Patient has a history of multiple colonic adenomas removed over time-his last colonoscopy was 2012 at which time he had (2) 8 mm adenomas removed from his ascending colon; he has also had innocent-appearing cecal AVMs and colonic diverticulosis. Dr. Cherlyn Roberts denies any bowel issues whatsoever;  denies melena and rectal bleeding. Mild erosive reflux esophagitis and mild non-H. Pylori gastritis on prior EGD. He takes omeprazole 20 mg daily when necessary reflux with good control. No dysphagia. He's gained almost 6 pounds since his last visit a little over 1 year ago.  History of multiple episodes of small bowel obstruction - status post 1 laparoscopy with lysis of adhesions. The other episodes resolved with conservative management; he's had no recurrent episodes since his last office visit.  He denies any significant intercurrent medical problems. He is on chronic prednisone therapy given that he had a pituitary adenoma removed previously. He did fine was last colonoscopy without receiving any stress dose steroids.  Past Medical History  Diagnosis Date  . GERD (gastroesophageal reflux disease)   . Personal history of pyloric stenosis     s/p treatment  . Panhypopituitarism [facility])     hx/s/p surgery [facility] Dr. Lonna Duval  . Hypertension   . Hypothyroidism   . Hepatitis B immune   . Hemianopsia     temporal  . Clostridium difficile colitis   . History of small bowel obstruction 07/11 / 01/12    x3  . Colon adenomas 2006    normal TCS Dr. Lindalou Hose  . H. pylori infection     treated x2 by Dr.Rehman  . Adenomatous polyps 2012  . Hx SBO     x3  . H/O Clostridium difficile infection     Past Surgical History  Procedure Laterality Date  .  Tonsillectomy    . Cataract extraction, bilateral    . Transsphenoidal resection pituitary adenoma    . Hand surgery  2007  . Esophagogastroduodenoscopy  09/21/2005    Dr.Rehman- normal egd  . Colonoscopy  04/18/2010    Dr.Tawana Pasch- normal rectum, ortuous elongated colon, scattered L sided diverticula, 2 ascending colon polyps, innocent- appearing cecal arteriovenous malformation bx= adenomatous polyps  . Colonoscopy  2006    Dr.Fleishman- negative  . Esophagogastroduodenoscopy N/A 12/03/2013    [id number] erosive reflux esophagitis. Small HH/Antral erosions-status post gastric biopsy.  . Esophageal dilation  12/03/2013    Procedure: ESOPHAGEAL DILATION;  Surgeon: Daneil Dolin, MD;  [facility];  Service: Endoscopy;;  . Laparotomy  2013    MMH:SBO, lysis of adhesions    Prior to Admission medications   Medication Sig Start Date End Date Taking? Authorizing Provider  amLODipine (NORVASC) 5 MG tablet Take 5 mg by mouth daily.     Yes Historical Provider, MD  benazepril (LOTENSIN) 10 MG tablet Take 30 mg by mouth daily.     Yes Historical Provider, MD  levothyroxine (SYNTHROID, LEVOTHROID) 150 MCG tablet Take 125 mcg by mouth daily.    Yes Historical Provider, MD  omeprazole (PRILOSEC) 20 MG capsule TAKE 1 CAPSULE BY MOUTH EVERY DAY Patient taking differently: PRN 12/17/14  Yes Carlis Stable, NP  predniSONE (DELTASONE) 5 MG tablet Take 7.5 mg by mouth daily.  Yes Historical Provider, MD  testosterone cypionate (DEPOTESTOTERONE CYPIONATE) 200 MG/ML injection Inject 100 mg into the muscle. 11/27/11  Yes Historical Provider, MD  polyethylene glycol-electrolytes (NULYTELY/GOLYTELY) 420 g solution Take 4,000 mLs by mouth once. 04/08/15   Daneil Dolin, MD    Allergies as of 04/08/2015  . (No Known Allergies)    No family history on file.  Social History   Social History  . Marital Status: Married    Spouse Name: N/A  . Number of Children: N/A  . Years of Education: N/A    Occupational History  . Not on file.   Social History Main Topics  . Smoking status: Never Smoker   . Smokeless tobacco: Not on file  . Alcohol Use: No  . Drug Use: No  . Sexual Activity: Not on file   Other Topics Concern  . Not on file   Social History Narrative    Review of Systems: See HPI, otherwise negative ROS  Physical Exam: BP 144/80 mmHg  Pulse 64  Temp(Src) 97.6 F (36.4 C) (Oral)  Ht 5\' 10"  (1.778 m)  Wt 222 lb 12.8 oz (101.061 kg)  BMI 31.97 kg/m2 General:   Very pleasant, sharp individual;  cooperative in NAD; Accompanied By his wife Skin:  Intact without significant lesions or rashes. Eyes:  Sclera clear, no icterus.   Conjunctiva pink. Lungs:  Clear throughout to auscultation.   No wheezes, crackles, or rhonchi. No acute distress. Heart:  Regular rate and rhythm; no murmurs, clicks, rubs,  or gallops. Abdomen: Non-distended, normal bowel sounds.  Soft and nontender without appreciable mass or hepatosplenomegaly.  Pulses:  Normal pulses noted. Extremities:  Without clubbing or edema. 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 We talked about the pros and cons of a colonoscopy at this point in time. He is a relatively fit 82 year old who has a very good quality of life. Given history of multiple colonic adenomas as well, I have recommended that we proceed with a diagnostic colonoscopy in the near future. History of erosive reflux esophagitis well-controlled on diet and when necessary use of omeprazole. No Barrett's epithelium seen at prior EGD. Clinically, doing well from the standpoint of reflux.  Recommendations:   Diagnostic colonoscopy in the near future.  The risks, benefits, limitations, alternatives and imponderables have been reviewed with the patient.  Questions have been answered. All parties are agreeable.   Continue with anti-antireflux diet. May use omeprazole on an as-needed basis. The benefits outweigh the risks. Patient instructed not to gain anymore weight and lose a little if  possible. Patient should take his blood pressure medication and usual dose of prednisone the morning of colonoscopy.   Notice: This dictation was prepared with Dragon dictation along with smaller phrase technology. Any transcriptional errors that result from this process are unintentional and may not be corrected upon review.

## 2015-04-12 DIAGNOSIS — Z9849 Cataract extraction status, unspecified eye: Secondary | ICD-10-CM | POA: Diagnosis not present

## 2015-04-12 DIAGNOSIS — H5347 Heteronymous bilateral field defects: Secondary | ICD-10-CM | POA: Diagnosis not present

## 2015-04-12 DIAGNOSIS — Z87891 Personal history of nicotine dependence: Secondary | ICD-10-CM | POA: Diagnosis not present

## 2015-04-12 DIAGNOSIS — H35343 Macular cyst, hole, or pseudohole, bilateral: Secondary | ICD-10-CM | POA: Diagnosis not present

## 2015-04-12 DIAGNOSIS — H47291 Other optic atrophy, right eye: Secondary | ICD-10-CM | POA: Diagnosis not present

## 2015-04-12 DIAGNOSIS — D352 Benign neoplasm of pituitary gland: Secondary | ICD-10-CM | POA: Diagnosis not present

## 2015-04-12 DIAGNOSIS — E230 Hypopituitarism: Secondary | ICD-10-CM | POA: Diagnosis not present

## 2015-04-12 DIAGNOSIS — Z9889 Other specified postprocedural states: Secondary | ICD-10-CM | POA: Diagnosis not present

## 2015-04-12 DIAGNOSIS — Z885 Allergy status to narcotic agent status: Secondary | ICD-10-CM | POA: Diagnosis not present

## 2015-04-12 DIAGNOSIS — H4720 Unspecified optic atrophy: Secondary | ICD-10-CM | POA: Diagnosis not present

## 2015-04-12 DIAGNOSIS — Z79899 Other long term (current) drug therapy: Secondary | ICD-10-CM | POA: Diagnosis not present

## 2015-04-12 DIAGNOSIS — H43821 Vitreomacular adhesion, right eye: Secondary | ICD-10-CM | POA: Diagnosis not present

## 2015-04-12 DIAGNOSIS — I10 Essential (primary) hypertension: Secondary | ICD-10-CM | POA: Diagnosis not present

## 2015-04-12 DIAGNOSIS — H4389 Other disorders of vitreous body: Secondary | ICD-10-CM | POA: Diagnosis not present

## 2015-04-12 DIAGNOSIS — Z961 Presence of intraocular lens: Secondary | ICD-10-CM | POA: Diagnosis not present

## 2015-05-04 ENCOUNTER — Ambulatory Visit (HOSPITAL_COMMUNITY)
Admission: RE | Admit: 2015-05-04 | Discharge: 2015-05-04 | Disposition: A | Discharge status: Home / Self Care | Payer: Medicare Other | Source: Ambulatory Visit | Attending: Internal Medicine | Admitting: Internal Medicine

## 2015-05-04 ENCOUNTER — Encounter (HOSPITAL_COMMUNITY): Payer: Self-pay

## 2015-05-04 ENCOUNTER — Encounter (HOSPITAL_COMMUNITY)
Admission: RE | Disposition: A | Discharge status: Home / Self Care | Payer: Self-pay | Source: Ambulatory Visit | Attending: Internal Medicine

## 2015-05-04 DIAGNOSIS — R195 Other fecal abnormalities: Secondary | ICD-10-CM | POA: Diagnosis not present

## 2015-05-04 DIAGNOSIS — K219 Gastro-esophageal reflux disease without esophagitis: Secondary | ICD-10-CM | POA: Insufficient documentation

## 2015-05-04 DIAGNOSIS — K921 Melena: Secondary | ICD-10-CM | POA: Insufficient documentation

## 2015-05-04 DIAGNOSIS — Z86010 Personal history of colonic polyps: Secondary | ICD-10-CM | POA: Insufficient documentation

## 2015-05-04 DIAGNOSIS — K5730 Diverticulosis of large intestine without perforation or abscess without bleeding: Secondary | ICD-10-CM | POA: Insufficient documentation

## 2015-05-04 DIAGNOSIS — Z7952 Long term (current) use of systemic steroids: Secondary | ICD-10-CM | POA: Insufficient documentation

## 2015-05-04 DIAGNOSIS — I10 Essential (primary) hypertension: Secondary | ICD-10-CM | POA: Insufficient documentation

## 2015-05-04 DIAGNOSIS — E039 Hypothyroidism, unspecified: Secondary | ICD-10-CM | POA: Diagnosis not present

## 2015-05-04 DIAGNOSIS — Z79899 Other long term (current) drug therapy: Secondary | ICD-10-CM | POA: Insufficient documentation

## 2015-05-04 HISTORY — PX: COLONOSCOPY: SHX5424

## 2015-05-04 LAB — CBC
HCT: 41 % (ref 39.0–52.0)
Hemoglobin: 13.3 g/dL (ref 13.0–17.0)
MCH: 30.1 pg (ref 26.0–34.0)
MCHC: 32.4 g/dL (ref 30.0–36.0)
MCV: 92.8 fL (ref 78.0–100.0)
Platelets: 178 10*3/uL (ref 150–400)
RBC: 4.42 MIL/uL (ref 4.22–5.81)
RDW: 13.6 % (ref 11.5–15.5)
WBC: 6.3 10*3/uL (ref 4.0–10.5)

## 2015-05-04 SURGERY — COLONOSCOPY
Anesthesia: Moderate Sedation

## 2015-05-04 MED ORDER — STERILE WATER FOR IRRIGATION IR SOLN
Status: DC | PRN
  Administered 2015-05-04: 2.5 mL

## 2015-05-04 MED ORDER — ONDANSETRON HCL 4 MG/2ML IJ SOLN
INTRAMUSCULAR | Status: AC
  Filled 2015-05-04: qty 2

## 2015-05-04 MED ORDER — SODIUM CHLORIDE 0.9 % IV SOLN
INTRAVENOUS | Status: DC
  Administered 2015-05-04: 09:00:00 via INTRAVENOUS

## 2015-05-04 MED ORDER — MEPERIDINE HCL 100 MG/ML IJ SOLN
INTRAMUSCULAR | Status: AC
  Filled 2015-05-04: qty 2

## 2015-05-04 MED ORDER — MIDAZOLAM HCL 5 MG/5ML IJ SOLN
INTRAMUSCULAR | Status: DC | PRN
  Administered 2015-05-04 (×3): 1 mg via INTRAVENOUS
  Administered 2015-05-04: 2 mg via INTRAVENOUS

## 2015-05-04 MED ORDER — MIDAZOLAM HCL 5 MG/5ML IJ SOLN
INTRAMUSCULAR | Status: AC
  Filled 2015-05-04: qty 10

## 2015-05-04 MED ORDER — MEPERIDINE HCL 100 MG/ML IJ SOLN
INTRAMUSCULAR | Status: DC | PRN
  Administered 2015-05-04: 50 mg via INTRAVENOUS
  Administered 2015-05-04: 25 mg via INTRAVENOUS

## 2015-05-04 NOTE — Interval H&P Note (Signed)
History and Physical Interval Note:  05/04/2015 9:10 AM  [name] [name]  has presented today for surgery, with the diagnosis of history of colon polyps, heme postive stool  The various methods of treatment have been discussed with the patient and family. After consideration of risks, benefits and other options for treatment, the patient has consented to  Procedure(s) with comments: COLONOSCOPY (N/A) - 0930 as a surgical intervention .  The patient's history has been reviewed, patient examined, no change in status, stable for surgery.  I have reviewed the patient's chart and labs.  Questions were answered to the patient's satisfaction.     Betania Dizon  No change. Diagnostic colonoscopy per plan.  The risks, benefits, limitations, alternatives and imponderables have been reviewed with the patient. Questions have been answered. All parties are agreeable.

## 2015-05-04 NOTE — Op Note (Signed)
[facility] [address] [place], [address]   COLONOSCOPY PROCEDURE REPORT  PATIENT: [name], [name]  MR#: [id number] BIRTHDATE: May 28, 1932 , 82  yrs. old GENDER: male ENDOSCOPIST: R.  Garfield Cornea, MD FACP [facility] REFERRED [id number] Woody Seller, M.D. PROCEDURE DATE:  30-May-2015 PROCEDURE:   Colonoscopy, diagnostic INDICATIONS:Hemoccult-positive stool; history of colonic adenoma. MEDICATIONS: Versed 5 mg IV and Demerol 75 mg IV in divided doses. Zofran 4 mg IV. ASA CLASS:       Class II  CONSENT: The risks, benefits, alternatives and imponderables including but not limited to bleeding, perforation as well as the possibility of a missed lesion have been reviewed.  The potential for biopsy, lesion removal, etc. have also been discussed. Questions have been answered.  All parties agreeable.  Please see the history and physical in the medical record for more information.  DESCRIPTION OF PROCEDURE:   After the risks benefits and alternatives of the procedure were thoroughly explained, informed consent was obtained.  The digital rectal exam revealed no abnormalities of the rectum.   The [id number]  endoscope was introduced through the anus and advanced to the cecum, which was identified by both the appendix and ileocecal valve. No adverse events experienced.   The quality of the prep was adequate  The instrument was then slowly withdrawn as the colon was fully examined. Estimated blood loss is zero unless otherwise noted in this procedure report.      COLON FINDINGS: Normal-appearing rectal mucosa.  Scattered left-sided diverticula; the remainder of the colonic mucosa appeared normal.  Retroflexion was performed. .  Withdrawal time=13 minutes 0 seconds.  The scope was withdrawn and the procedure completed. COMPLICATIONS: There were no immediate complications.  ENDOSCOPIC IMPRESSION: Colonic diverticulosis  RECOMMENDATIONS: CBC today to determine  hemoglobin, etc. Given his age and today's findings, I do not necessarily recommend a future colonoscopy unless new symptoms arise  eSigned:  R. Garfield Cornea, MD Rosalita Chessman [facility] 05-30-15 9:59 AM   cc:  CPT CODES: ICD CODES:  The ICD and CPT codes recommended by this software are interpretations from the data that the clinical staff has captured with the software.  The verification of the translation of this report to the ICD and CPT codes and modifiers is the sole responsibility of the health care institution and practicing physician where this report was generated.  [employer] will not be held responsible for the validity of the ICD and CPT codes included on this report.  AMA assumes no liability for data contained or not contained herein. CPT is a Designer, television/film set of the [employer].  PATIENT NAME:  [name] [name], [name] [name] MR#: [id number]

## 2015-05-04 NOTE — H&P (View-Only) (Signed)
Primary Care Physician:  Glenda Chroman., MD Primary Gastroenterologist:  Dr. Gala Romney  Pre-Procedure History & Physical: HPI:  [name] [name] is a 82 y.o. male here for  For GI follow-up. Recently found to be occult blood positive on a mail in card. Patient has a history of multiple colonic adenomas removed over time-his last colonoscopy was 2012 at which time he had (2) 8 mm adenomas removed from his ascending colon; he has also had innocent-appearing cecal AVMs and colonic diverticulosis. Dr. Cherlyn Roberts denies any bowel issues whatsoever;  denies melena and rectal bleeding. Mild erosive reflux esophagitis and mild non-H. Pylori gastritis on prior EGD. He takes omeprazole 20 mg daily when necessary reflux with good control. No dysphagia. He's gained almost 6 pounds since his last visit a little over 1 year ago.  History of multiple episodes of small bowel obstruction - status post 1 laparoscopy with lysis of adhesions. The other episodes resolved with conservative management; he's had no recurrent episodes since his last office visit.  He denies any significant intercurrent medical problems. He is on chronic prednisone therapy given that he had a pituitary adenoma removed previously. He did fine was last colonoscopy without receiving any stress dose steroids.  Past Medical History  Diagnosis Date  . GERD (gastroesophageal reflux disease)   . Personal history of pyloric stenosis     s/p treatment  . Panhypopituitarism [facility])     hx/s/p surgery [facility] Dr. Lonna Duval  . Hypertension   . Hypothyroidism   . Hepatitis B immune   . Hemianopsia     temporal  . Clostridium difficile colitis   . History of small bowel obstruction 07/11 / 01/12    x3  . Colon adenomas 2006    normal TCS Dr. Lindalou Hose  . H. pylori infection     treated x2 by Dr.Rehman  . Adenomatous polyps 2012  . Hx SBO     x3  . H/O Clostridium difficile infection     Past Surgical History  Procedure Laterality Date  .  Tonsillectomy    . Cataract extraction, bilateral    . Transsphenoidal resection pituitary adenoma    . Hand surgery  2007  . Esophagogastroduodenoscopy  09/21/2005    Dr.Rehman- normal egd  . Colonoscopy  04/18/2010    Dr.Halina Asano- normal rectum, ortuous elongated colon, scattered L sided diverticula, 2 ascending colon polyps, innocent- appearing cecal arteriovenous malformation bx= adenomatous polyps  . Colonoscopy  2006    Dr.Fleishman- negative  . Esophagogastroduodenoscopy N/A 12/03/2013    [id number] erosive reflux esophagitis. Small HH/Antral erosions-status post gastric biopsy.  . Esophageal dilation  12/03/2013    Procedure: ESOPHAGEAL DILATION;  Surgeon: Daneil Dolin, MD;  [facility];  Service: Endoscopy;;  . Laparotomy  2013    MMH:SBO, lysis of adhesions    Prior to Admission medications   Medication Sig Start Date End Date Taking? Authorizing Provider  amLODipine (NORVASC) 5 MG tablet Take 5 mg by mouth daily.     Yes Historical Provider, MD  benazepril (LOTENSIN) 10 MG tablet Take 30 mg by mouth daily.     Yes Historical Provider, MD  levothyroxine (SYNTHROID, LEVOTHROID) 150 MCG tablet Take 125 mcg by mouth daily.    Yes Historical Provider, MD  omeprazole (PRILOSEC) 20 MG capsule TAKE 1 CAPSULE BY MOUTH EVERY DAY Patient taking differently: PRN 12/17/14  Yes Carlis Stable, NP  predniSONE (DELTASONE) 5 MG tablet Take 7.5 mg by mouth daily.  Yes Historical Provider, MD  testosterone cypionate (DEPOTESTOTERONE CYPIONATE) 200 MG/ML injection Inject 100 mg into the muscle. 11/27/11  Yes Historical Provider, MD  polyethylene glycol-electrolytes (NULYTELY/GOLYTELY) 420 g solution Take 4,000 mLs by mouth once. 04/08/15   Daneil Dolin, MD    Allergies as of 04/08/2015  . (No Known Allergies)    No family history on file.  Social History   Social History  . Marital Status: Married    Spouse Name: N/A  . Number of Children: N/A  . Years of Education: N/A    Occupational History  . Not on file.   Social History Main Topics  . Smoking status: Never Smoker   . Smokeless tobacco: Not on file  . Alcohol Use: No  . Drug Use: No  . Sexual Activity: Not on file   Other Topics Concern  . Not on file   Social History Narrative    Review of Systems: See HPI, otherwise negative ROS  Physical Exam: BP 144/80 mmHg  Pulse 64  Temp(Src) 97.6 F (36.4 C) (Oral)  Ht 5\' 10"  (1.778 m)  Wt 222 lb 12.8 oz (101.061 kg)  BMI 31.97 kg/m2 General:   Very pleasant, sharp individual;  cooperative in NAD; Accompanied By his wife Skin:  Intact without significant lesions or rashes. Eyes:  Sclera clear, no icterus.   Conjunctiva pink. Lungs:  Clear throughout to auscultation.   No wheezes, crackles, or rhonchi. No acute distress. Heart:  Regular rate and rhythm; no murmurs, clicks, rubs,  or gallops. Abdomen: Non-distended, normal bowel sounds.  Soft and nontender without appreciable mass or hepatosplenomegaly.  Pulses:  Normal pulses noted. Extremities:  Without clubbing or edema. 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 We talked about the pros and cons of a colonoscopy at this point in time. He is a relatively fit 82 year old who has a very good quality of life. Given history of multiple colonic adenomas as well, I have recommended that we proceed with a diagnostic colonoscopy in the near future. History of erosive reflux esophagitis well-controlled on diet and when necessary use of omeprazole. No Barrett's epithelium seen at prior EGD. Clinically, doing well from the standpoint of reflux.  Recommendations:   Diagnostic colonoscopy in the near future.  The risks, benefits, limitations, alternatives and imponderables have been reviewed with the patient.  Questions have been answered. All parties are agreeable.   Continue with anti-antireflux diet. May use omeprazole on an as-needed basis. The benefits outweigh the risks. Patient instructed not to gain anymore weight and lose a little if  possible. Patient should take his blood pressure medication and usual dose of prednisone the morning of colonoscopy.   Notice: This dictation was prepared with Dragon dictation along with smaller phrase technology. Any transcriptional errors that result from this process are unintentional and may not be corrected upon review.

## 2015-05-04 NOTE — Discharge Instructions (Signed)
Colonoscopy Discharge Instructions  Read the instructions outlined below and refer to this sheet in the next few weeks. These discharge instructions provide you with general information on caring for yourself after you leave the hospital. Your doctor may also give you specific instructions. While your treatment has been planned according to the most current medical practices available, unavoidable complications occasionally occur. If you have any problems or questions after discharge, call Dr. Gala Romney at [phone]. ACTIVITY  You may resume your regular activity, but move at a slower pace for the next 24 hours.   Take frequent rest periods for the next 24 hours.   Walking will help get rid of the air and reduce the bloated feeling in your belly (abdomen).   No driving for 24 hours (because of the medicine (anesthesia) used during the test).    Do not sign any important legal documents or operate any machinery for 24 hours (because of the anesthesia used during the test).  NUTRITION  Drink plenty of fluids.   You may resume your normal diet as instructed by your doctor.   Begin with a light meal and progress to your normal diet. Heavy or fried foods are harder to digest and may make you feel sick to your stomach (nauseated).   Avoid alcoholic beverages for 24 hours or as instructed.  MEDICATIONS  You may resume your normal medications unless your doctor tells you otherwise.  WHAT YOU CAN EXPECT TODAY  Some feelings of bloating in the abdomen.   Passage of more gas than usual.   Spotting of blood in your stool or on the toilet paper.  IF YOU HAD POLYPS REMOVED DURING THE COLONOSCOPY:  No aspirin products for 7 days or as instructed.   No alcohol for 7 days or as instructed.   Eat a soft diet for the next 24 hours.  FINDING OUT THE RESULTS OF YOUR TEST Not all test results are available during your visit. If your test results are not back during the visit, make an appointment  with your caregiver to find out the results. Do not assume everything is normal if you have not heard from your caregiver or the medical facility. It is important for you to follow up on all of your test results.  SEEK IMMEDIATE MEDICAL ATTENTION IF:  You have more than a spotting of blood in your stool.   Your belly is swollen (abdominal distention).   You are nauseated or vomiting.   You have a temperature over 101.   You have abdominal pain or discomfort that is severe or gets worse throughout the day.    Diverticulosis information provided  I do not necessarily recommend a future colonoscopy unless new symptoms develop  CBC today  Further recommendations to follow     Diverticulosis Diverticulosis is the condition that develops when small pouches (diverticula) form in the wall of your colon. Your colon, or large intestine, is where water is absorbed and stool is formed. The pouches form when the inside layer of your colon pushes through weak spots in the outer layers of your colon. CAUSES  No one knows exactly what causes diverticulosis. RISK FACTORS  Being older than 6. Your risk for this condition increases with age. Diverticulosis is rare in people younger than 40 years. By age 2, almost everyone has it.  Eating a low-fiber diet.  Being frequently constipated.  Being overweight.  Not getting enough exercise.  Smoking.  Taking over-the-counter pain medicines, like aspirin and ibuprofen. SYMPTOMS  Most people with diverticulosis do not have symptoms. DIAGNOSIS  Because diverticulosis often has no symptoms, health care providers often discover the condition during an exam for other colon problems. In many cases, a health care provider will diagnose diverticulosis while using a flexible scope to examine the colon (colonoscopy). TREATMENT  If you have never developed an infection related to diverticulosis, you may not need treatment. If you have had an infection  before, treatment may include:  Eating more fruits, vegetables, and grains.  Taking a fiber supplement.  Taking a live bacteria supplement (probiotic).  Taking medicine to relax your colon. HOME CARE INSTRUCTIONS   Drink at least 6-8 glasses of water each day to prevent constipation.  Try not to strain when you have a bowel movement.  Keep all follow-up appointments. If you have had an infection before:  Increase the fiber in your diet as directed by your health care provider or dietitian.  Take a dietary fiber supplement if your health care provider approves.  Only take medicines as directed by your health care provider. SEEK MEDICAL CARE IF:   You have abdominal pain.  You have bloating.  You have cramps.  You have not gone to the bathroom in 3 days. SEEK IMMEDIATE MEDICAL CARE IF:   Your pain gets worse.  Yourbloating becomes very bad.  You have a fever or chills, and your symptoms suddenly get worse.  You begin vomiting.  You have bowel movements that are bloody or black. MAKE SURE YOU:  Understand these instructions.  Will watch your condition.  Will get help right away if you are not doing well or get worse.   This information is not intended to replace advice given to you by your health care provider. Make sure you discuss any questions you have with your health care provider.   Document Released: 12/08/2003 Document Revised: 03/17/2013 Document Reviewed: 02/04/2013 Elsevier Interactive Patient Education [employer].

## 2015-05-05 ENCOUNTER — Encounter (HOSPITAL_COMMUNITY): Payer: Self-pay | Admitting: Internal Medicine

## 2015-05-30 DIAGNOSIS — H40053 Ocular hypertension, bilateral: Secondary | ICD-10-CM | POA: Diagnosis not present

## 2015-11-17 DIAGNOSIS — E230 Hypopituitarism: Secondary | ICD-10-CM | POA: Diagnosis not present

## 2015-11-17 DIAGNOSIS — E038 Other specified hypothyroidism: Secondary | ICD-10-CM | POA: Diagnosis not present

## 2015-11-17 DIAGNOSIS — E2749 Other adrenocortical insufficiency: Secondary | ICD-10-CM | POA: Diagnosis not present

## 2015-11-17 DIAGNOSIS — E291 Testicular hypofunction: Secondary | ICD-10-CM | POA: Diagnosis not present

## 2016-02-04 DIAGNOSIS — Z23 Encounter for immunization: Secondary | ICD-10-CM | POA: Diagnosis not present

## 2016-02-29 DIAGNOSIS — Z125 Encounter for screening for malignant neoplasm of prostate: Secondary | ICD-10-CM | POA: Diagnosis not present

## 2016-02-29 DIAGNOSIS — Z0000 Encounter for general adult medical examination without abnormal findings: Secondary | ICD-10-CM | POA: Diagnosis not present

## 2016-02-29 DIAGNOSIS — Z79899 Other long term (current) drug therapy: Secondary | ICD-10-CM | POA: Diagnosis not present

## 2016-02-29 DIAGNOSIS — R5383 Other fatigue: Secondary | ICD-10-CM | POA: Diagnosis not present

## 2016-02-29 DIAGNOSIS — Z7189 Other specified counseling: Secondary | ICD-10-CM | POA: Diagnosis not present

## 2016-02-29 DIAGNOSIS — Z299 Encounter for prophylactic measures, unspecified: Secondary | ICD-10-CM | POA: Diagnosis not present

## 2016-02-29 DIAGNOSIS — E039 Hypothyroidism, unspecified: Secondary | ICD-10-CM | POA: Diagnosis not present

## 2016-02-29 DIAGNOSIS — Z1389 Encounter for screening for other disorder: Secondary | ICD-10-CM | POA: Diagnosis not present

## 2016-04-23 DIAGNOSIS — H40053 Ocular hypertension, bilateral: Secondary | ICD-10-CM | POA: Diagnosis not present

## 2016-08-14 ENCOUNTER — Encounter (INDEPENDENT_AMBULATORY_CARE_PROVIDER_SITE_OTHER): Payer: Medicare Other | Admitting: Ophthalmology

## 2016-08-14 DIAGNOSIS — H35343 Macular cyst, hole, or pseudohole, bilateral: Secondary | ICD-10-CM | POA: Diagnosis not present

## 2016-08-14 DIAGNOSIS — H47211 Primary optic atrophy, right eye: Secondary | ICD-10-CM | POA: Diagnosis not present

## 2016-08-14 DIAGNOSIS — H43813 Vitreous degeneration, bilateral: Secondary | ICD-10-CM

## 2016-08-14 DIAGNOSIS — H353121 Nonexudative age-related macular degeneration, left eye, early dry stage: Secondary | ICD-10-CM | POA: Diagnosis not present

## 2016-08-14 DIAGNOSIS — H35033 Hypertensive retinopathy, bilateral: Secondary | ICD-10-CM | POA: Diagnosis not present

## 2016-08-14 DIAGNOSIS — I10 Essential (primary) hypertension: Secondary | ICD-10-CM | POA: Diagnosis not present

## 2016-09-14 DIAGNOSIS — I10 Essential (primary) hypertension: Secondary | ICD-10-CM | POA: Diagnosis not present

## 2016-09-14 DIAGNOSIS — E669 Obesity, unspecified: Secondary | ICD-10-CM | POA: Diagnosis not present

## 2016-09-14 DIAGNOSIS — Z79899 Other long term (current) drug therapy: Secondary | ICD-10-CM | POA: Diagnosis not present

## 2016-09-14 DIAGNOSIS — R1110 Vomiting, unspecified: Secondary | ICD-10-CM | POA: Diagnosis not present

## 2016-09-14 DIAGNOSIS — R197 Diarrhea, unspecified: Secondary | ICD-10-CM | POA: Diagnosis not present

## 2016-09-14 DIAGNOSIS — E039 Hypothyroidism, unspecified: Secondary | ICD-10-CM | POA: Diagnosis not present

## 2016-09-14 DIAGNOSIS — Z7952 Long term (current) use of systemic steroids: Secondary | ICD-10-CM | POA: Diagnosis not present

## 2016-09-14 DIAGNOSIS — R112 Nausea with vomiting, unspecified: Secondary | ICD-10-CM | POA: Diagnosis not present

## 2016-09-14 DIAGNOSIS — E860 Dehydration: Secondary | ICD-10-CM | POA: Diagnosis not present

## 2016-09-17 ENCOUNTER — Telehealth: Payer: Self-pay | Admitting: Internal Medicine

## 2016-09-17 NOTE — Telephone Encounter (Signed)
Spoke with the pt, he said he developed gastroenteritis on Thursday. He had diarrhea on Thursday and Friday, Nausea and vomiting on Friday. He went to [facility] Friday and was given fluids and IV hydrocortisone and sent home. He said he felt bad all day on Saturday but started feeling better yesterday. He currently feels better but still has diarrhea. Pt said he has not been on any recent abx. He is able to hydrate now but he doesn't know what he should take for the diarrhea since he has a history of a bowel obstruction. He would like an ov but we do not have anything available. Can we give him any recommendations?

## 2016-09-17 NOTE — Telephone Encounter (Signed)
[phone]  Patient called and stated that he went to the er in [place] about his stomach and needs to be seen, asked if he could be seen today because he has diarrhea today

## 2016-09-18 NOTE — Telephone Encounter (Signed)
I would suggest adding probiotics, such as Doren Custard colon health or Godwin.  Avoid dairy, greasy/fatty foods especially while stools are still loose.  If he absolutely needed something for the diarrhea, I would advise no more than imodium 1/2 tablet (1mg ) twice a day MAX.   Tell Dr. Radene Gunning to please call if any further questions.

## 2016-09-18 NOTE — Telephone Encounter (Signed)
Tried to call pt- NA- LMOM 

## 2016-09-18 NOTE — Telephone Encounter (Signed)
Pt called office and was informed of LSL's recommendations. States he is doing better today.

## 2016-10-02 DIAGNOSIS — Z299 Encounter for prophylactic measures, unspecified: Secondary | ICD-10-CM | POA: Diagnosis not present

## 2016-10-02 DIAGNOSIS — E039 Hypothyroidism, unspecified: Secondary | ICD-10-CM | POA: Diagnosis not present

## 2016-10-02 DIAGNOSIS — R279 Unspecified lack of coordination: Secondary | ICD-10-CM | POA: Diagnosis not present

## 2016-10-02 DIAGNOSIS — K589 Irritable bowel syndrome without diarrhea: Secondary | ICD-10-CM | POA: Diagnosis not present

## 2016-10-02 DIAGNOSIS — I10 Essential (primary) hypertension: Secondary | ICD-10-CM | POA: Diagnosis not present

## 2016-10-02 DIAGNOSIS — N400 Enlarged prostate without lower urinary tract symptoms: Secondary | ICD-10-CM | POA: Diagnosis not present

## 2016-10-02 DIAGNOSIS — Z6830 Body mass index (BMI) 30.0-30.9, adult: Secondary | ICD-10-CM | POA: Diagnosis not present

## 2016-10-02 DIAGNOSIS — E220 Acromegaly and pituitary gigantism: Secondary | ICD-10-CM | POA: Diagnosis not present

## 2016-10-09 ENCOUNTER — Encounter: Payer: Self-pay | Admitting: Internal Medicine

## 2016-10-09 ENCOUNTER — Ambulatory Visit: Payer: Medicare Other | Admitting: Gastroenterology

## 2016-10-09 ENCOUNTER — Ambulatory Visit (INDEPENDENT_AMBULATORY_CARE_PROVIDER_SITE_OTHER): Payer: Medicare Other | Admitting: Internal Medicine

## 2016-10-09 VITALS — BP 121/70 | HR 69 | Temp 96.8°F | Ht 70.0 in | Wt 212.0 lb

## 2016-10-09 DIAGNOSIS — R197 Diarrhea, unspecified: Secondary | ICD-10-CM

## 2016-10-09 NOTE — Patient Instructions (Signed)
May use Imodium up to twice a day  Trial of Align - one capsule daily  Limit caffeine in diet  Call me in 3 weeks to let me know how its going  If persistent symptoms, further evaluation may be needed

## 2016-10-09 NOTE — Progress Notes (Signed)
Primary Care Physician:  Glenda Chroman, MD Primary Gastroenterologist:  Dr. Gala Romney  Pre-Procedure History & Physical: HPI:  [name] [name] is a 83 y.o. male here for further evaluation of change in bowel habits over the past for 5 weeks. Patient tells me he's had about one episode every week or so of urgent need to have a bowel movement and then having watery nonbloody BMs. Bowel function then reverts back to normal -  having one formed bowel movement daily to every other day for several days before he has another bout. States more likely have symptoms when he eats a big meal. For instance, when he goes to seafood( fried with traditional sides)  Weight stable. He is reported to be on adequate hormone replacement therapy given his history of pituitary surgery. No one else around him has diarrhea. He has city water at home. No recent travel. No new medications, supplements or artificial sweeteners. No symptoms consistent with lactose insufficiency. Takes Nexium about once a week when he has a flare of GERD which is infrequent. No dysphagia or early satiety, nausea or vomiting. Hasn't had any fever or chills. He has a history of multiple small bowel obstructions which have resolved with conservative means. He did have lysis of adhesion by Dr.De Cornelia Copa a couple years ago and since that time he's done very well.   Past Medical History:  Diagnosis Date  . Adenomatous polyps 2012  . Clostridium difficile colitis   . Colon adenomas 2006   normal TCS Dr. Lindalou Hose  . GERD (gastroesophageal reflux disease)   . H. pylori infection    treated x2 by Dr.Rehman  . H/O Clostridium difficile infection   . Hemianopsia    temporal  . Hepatitis B immune   . History of small bowel obstruction 07/11 / 01/12   x3  . Hx SBO    x3  . Hypertension   . Hypothyroidism   . Panhypopituitarism [facility])    hx/s/p surgery [facility] Dr. Lonna Duval  . Personal history of pyloric stenosis    s/p treatment    Past  Surgical History:  Procedure Laterality Date  . CATARACT EXTRACTION, BILATERAL    . COLONOSCOPY  04/18/2010   Dr.Patte Winkel- normal rectum, ortuous elongated colon, scattered L sided diverticula, 2 ascending colon polyps, innocent- appearing cecal arteriovenous malformation bx= adenomatous polyps  . COLONOSCOPY  2006   Dr.Fleishman- negative  . COLONOSCOPY N/A 05/04/2015   Procedure: COLONOSCOPY;  Surgeon: Daneil Dolin, MD;  Location: [facility];  Service: Endoscopy;  Laterality: N/A;  0930  . ESOPHAGEAL DILATION  12/03/2013   Procedure: ESOPHAGEAL DILATION;  Surgeon: Daneil Dolin, MD;  Location: [facility];  Service: Endoscopy;;  . ESOPHAGOGASTRODUODENOSCOPY  09/21/2005   Dr.Rehman- normal egd  . ESOPHAGOGASTRODUODENOSCOPY N/A 12/03/2013   [facility]:SEGB erosive reflux esophagitis. Small HH/Antral erosions-status post gastric biopsy.  Marland Kitchen HAND SURGERY  2007  . LAPAROTOMY  2013   [facility]:SBO, lysis of adhesions  . TONSILLECTOMY    . transsphenoidal resection pituitary adenoma      Prior to Admission medications   Medication Sig Start Date End Date Taking? Authorizing Provider  amLODipine (NORVASC) 5 MG tablet Take 5 mg by mouth daily.     Yes [provider]  benazepril (LOTENSIN) 10 MG tablet Take 40 mg by mouth daily.    Yes [provider]  levothyroxine (SYNTHROID, LEVOTHROID) 150 MCG tablet Take 125 mcg by mouth daily.    Yes [provider]  loperamide (IMODIUM) 2 MG capsule Take 2 mg by mouth as needed for diarrhea or loose stools.   Yes [provider]  omeprazole (PRILOSEC) 20 MG capsule TAKE 1 CAPSULE BY MOUTH EVERY DAY Patient taking differently: PRN 12/17/14  Yes Gill, Eric A, NP  predniSONE (DELTASONE) 5 MG tablet Take 7.5 mg by mouth daily.    Yes [provider]  saw palmetto 160 MG capsule Take 160 mg by mouth 2 (two) times daily.   Yes [provider]  testosterone cypionate (DEPOTESTOTERONE CYPIONATE) 200 MG/ML injection Inject  100 mg into the muscle every 14 (fourteen) days.  11/27/11  Yes [provider]  polyethylene glycol-electrolytes (NULYTELY/GOLYTELY) 420 g solution Take 4,000 mLs by mouth once. Patient not taking: Reported on 10/09/2016 04/08/15   Daneil Dolin, MD    Allergies as of 10/09/2016  . (No Known Allergies)    No family history on file.  Social History   Social History  . Marital status: Married    Spouse name: N/A  . Number of children: N/A  . Years of education: N/A   Occupational History  . Not on file.   Social History Main Topics  . Smoking status: Never Smoker  . Smokeless tobacco: Never Used  . Alcohol use No  . Drug use: No  . Sexual activity: Not on file   Other Topics Concern  . Not on file   Social History Narrative  . No narrative on file    Review of Systems: See HPI, otherwise negative ROS  Physical Exam: BP 121/70   Pulse 69   Temp (!) 96.8 F (36 C) (Oral)   Ht 5\' 10"  (1.778 m)   Wt 212 lb (96.2 kg)   BMI 30.42 kg/m  General:   Alert,  Well-developed, well-nourished, pleasant and cooperative in NAD; He is accompanied by his spouse. Neck:  Supple; no masses or thyromegaly. No significant cervical adenopathy. Lungs:  Clear throughout to auscultation.   No wheezes, crackles, or rhonchi. No acute distress. Heart:  Regular rate and rhythm; no murmurs, clicks, rubs,  or gallops. Abdomen: Non-distended, normal bowel sounds.  Soft and nontender without appreciable mass or hepatosplenomegaly.  Pulses:  Normal pulses noted. Extremities:  Without clubbing or edema.   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 Diarrhea  -  notably intermittent with normal bowel function for days in between episodes.  I doubt he has infectious or inflammatory diarrhea. Nothing really to suggest malabsorption. There is a temporal relationship between overeating and his symptoms.  Presentation not consistent with bacterial overgrowth.    Recommendations:  May use Imodium up to twice a day  Trial of Align - one capsule daily  Limit caffeine in diet;  Prudent diet recommended.  Call me in 3 weeks to let me know how its going  If persistent symptoms, further evaluation may be needed                            Notice: This dictation was prepared with Dragon dictation along with smaller phrase technology. Any transcriptional errors that result from this process are unintentional and may not be corrected upon review.

## 2016-10-10 ENCOUNTER — Encounter (INDEPENDENT_AMBULATORY_CARE_PROVIDER_SITE_OTHER): Payer: Medicare Other | Admitting: Ophthalmology

## 2016-11-01 ENCOUNTER — Telehealth: Payer: Self-pay

## 2016-11-01 NOTE — Telephone Encounter (Signed)
Pt called and he much better and he does not need to return at this time.

## 2016-11-01 NOTE — Telephone Encounter (Signed)
Noted  

## 2016-11-01 NOTE — Telephone Encounter (Signed)
Wonderful. He is to call if he needs [place].

## 2016-11-07 ENCOUNTER — Encounter (INDEPENDENT_AMBULATORY_CARE_PROVIDER_SITE_OTHER): Payer: Medicare Other | Admitting: Ophthalmology

## 2016-11-07 DIAGNOSIS — H53451 Other localized visual field defect, right eye: Secondary | ICD-10-CM | POA: Diagnosis not present

## 2016-11-07 DIAGNOSIS — H47211 Primary optic atrophy, right eye: Secondary | ICD-10-CM | POA: Diagnosis not present

## 2016-11-20 DIAGNOSIS — E237 Disorder of pituitary gland, unspecified: Secondary | ICD-10-CM | POA: Diagnosis not present

## 2016-11-20 DIAGNOSIS — E230 Hypopituitarism: Secondary | ICD-10-CM | POA: Diagnosis not present

## 2016-11-20 DIAGNOSIS — R2689 Other abnormalities of gait and mobility: Secondary | ICD-10-CM | POA: Diagnosis not present

## 2016-12-06 DIAGNOSIS — E2740 Unspecified adrenocortical insufficiency: Secondary | ICD-10-CM | POA: Diagnosis not present

## 2016-12-06 DIAGNOSIS — Z823 Family history of stroke: Secondary | ICD-10-CM | POA: Diagnosis not present

## 2016-12-06 DIAGNOSIS — Z886 Allergy status to analgesic agent status: Secondary | ICD-10-CM | POA: Diagnosis not present

## 2016-12-06 DIAGNOSIS — Z8249 Family history of ischemic heart disease and other diseases of the circulatory system: Secondary | ICD-10-CM | POA: Diagnosis not present

## 2016-12-06 DIAGNOSIS — R918 Other nonspecific abnormal finding of lung field: Secondary | ICD-10-CM | POA: Diagnosis not present

## 2016-12-06 DIAGNOSIS — Z79899 Other long term (current) drug therapy: Secondary | ICD-10-CM | POA: Diagnosis not present

## 2016-12-06 DIAGNOSIS — K56609 Unspecified intestinal obstruction, unspecified as to partial versus complete obstruction: Secondary | ICD-10-CM | POA: Diagnosis present

## 2016-12-06 DIAGNOSIS — J189 Pneumonia, unspecified organism: Secondary | ICD-10-CM | POA: Diagnosis not present

## 2016-12-06 DIAGNOSIS — R1084 Generalized abdominal pain: Secondary | ICD-10-CM | POA: Diagnosis not present

## 2016-12-06 DIAGNOSIS — Z888 Allergy status to other drugs, medicaments and biological substances status: Secondary | ICD-10-CM | POA: Diagnosis not present

## 2016-12-06 DIAGNOSIS — I10 Essential (primary) hypertension: Secondary | ICD-10-CM | POA: Diagnosis present

## 2016-12-06 DIAGNOSIS — R140 Abdominal distension (gaseous): Secondary | ICD-10-CM | POA: Diagnosis not present

## 2016-12-06 DIAGNOSIS — Z7952 Long term (current) use of systemic steroids: Secondary | ICD-10-CM | POA: Diagnosis not present

## 2016-12-14 DIAGNOSIS — E039 Hypothyroidism, unspecified: Secondary | ICD-10-CM | POA: Diagnosis not present

## 2016-12-14 DIAGNOSIS — N400 Enlarged prostate without lower urinary tract symptoms: Secondary | ICD-10-CM | POA: Diagnosis not present

## 2016-12-14 DIAGNOSIS — I10 Essential (primary) hypertension: Secondary | ICD-10-CM | POA: Diagnosis not present

## 2016-12-14 DIAGNOSIS — K56609 Unspecified intestinal obstruction, unspecified as to partial versus complete obstruction: Secondary | ICD-10-CM | POA: Diagnosis not present

## 2016-12-14 DIAGNOSIS — Z09 Encounter for follow-up examination after completed treatment for conditions other than malignant neoplasm: Secondary | ICD-10-CM | POA: Diagnosis not present

## 2016-12-14 DIAGNOSIS — E220 Acromegaly and pituitary gigantism: Secondary | ICD-10-CM | POA: Diagnosis not present

## 2016-12-14 DIAGNOSIS — Z6830 Body mass index (BMI) 30.0-30.9, adult: Secondary | ICD-10-CM | POA: Diagnosis not present

## 2016-12-14 DIAGNOSIS — J189 Pneumonia, unspecified organism: Secondary | ICD-10-CM | POA: Diagnosis not present

## 2016-12-14 DIAGNOSIS — E538 Deficiency of other specified B group vitamins: Secondary | ICD-10-CM | POA: Diagnosis not present

## 2017-01-24 DIAGNOSIS — R05 Cough: Secondary | ICD-10-CM | POA: Diagnosis not present

## 2017-01-24 DIAGNOSIS — Z87891 Personal history of nicotine dependence: Secondary | ICD-10-CM | POA: Diagnosis not present

## 2017-01-24 DIAGNOSIS — J189 Pneumonia, unspecified organism: Secondary | ICD-10-CM | POA: Diagnosis not present

## 2017-02-27 DIAGNOSIS — E291 Testicular hypofunction: Secondary | ICD-10-CM | POA: Diagnosis not present

## 2017-03-06 DIAGNOSIS — Z0000 Encounter for general adult medical examination without abnormal findings: Secondary | ICD-10-CM | POA: Diagnosis not present

## 2017-03-06 DIAGNOSIS — Z1339 Encounter for screening examination for other mental health and behavioral disorders: Secondary | ICD-10-CM | POA: Diagnosis not present

## 2017-03-06 DIAGNOSIS — Z1331 Encounter for screening for depression: Secondary | ICD-10-CM | POA: Diagnosis not present

## 2017-03-06 DIAGNOSIS — I10 Essential (primary) hypertension: Secondary | ICD-10-CM | POA: Diagnosis not present

## 2017-03-06 DIAGNOSIS — Z125 Encounter for screening for malignant neoplasm of prostate: Secondary | ICD-10-CM | POA: Diagnosis not present

## 2017-03-06 DIAGNOSIS — Z299 Encounter for prophylactic measures, unspecified: Secondary | ICD-10-CM | POA: Diagnosis not present

## 2017-03-06 DIAGNOSIS — Z7189 Other specified counseling: Secondary | ICD-10-CM | POA: Diagnosis not present

## 2017-03-06 DIAGNOSIS — Z6831 Body mass index (BMI) 31.0-31.9, adult: Secondary | ICD-10-CM | POA: Diagnosis not present

## 2017-03-06 DIAGNOSIS — Z1211 Encounter for screening for malignant neoplasm of colon: Secondary | ICD-10-CM | POA: Diagnosis not present

## 2017-03-06 DIAGNOSIS — R5383 Other fatigue: Secondary | ICD-10-CM | POA: Diagnosis not present

## 2017-03-06 DIAGNOSIS — E039 Hypothyroidism, unspecified: Secondary | ICD-10-CM | POA: Diagnosis not present

## 2017-03-06 DIAGNOSIS — Z79899 Other long term (current) drug therapy: Secondary | ICD-10-CM | POA: Diagnosis not present

## 2017-04-02 DIAGNOSIS — E039 Hypothyroidism, unspecified: Secondary | ICD-10-CM | POA: Diagnosis not present

## 2017-04-26 DIAGNOSIS — Z23 Encounter for immunization: Secondary | ICD-10-CM | POA: Diagnosis not present

## 2017-05-29 DIAGNOSIS — H40053 Ocular hypertension, bilateral: Secondary | ICD-10-CM | POA: Diagnosis not present

## 2017-05-30 DIAGNOSIS — E291 Testicular hypofunction: Secondary | ICD-10-CM | POA: Diagnosis not present

## 2017-05-30 DIAGNOSIS — E2749 Other adrenocortical insufficiency: Secondary | ICD-10-CM | POA: Diagnosis not present

## 2017-05-30 DIAGNOSIS — D352 Benign neoplasm of pituitary gland: Secondary | ICD-10-CM | POA: Diagnosis not present

## 2017-05-30 DIAGNOSIS — R948 Abnormal results of function studies of other organs and systems: Secondary | ICD-10-CM | POA: Diagnosis not present

## 2017-05-30 DIAGNOSIS — E038 Other specified hypothyroidism: Secondary | ICD-10-CM | POA: Diagnosis not present

## 2017-05-30 DIAGNOSIS — E230 Hypopituitarism: Secondary | ICD-10-CM | POA: Diagnosis not present

## 2017-09-17 DIAGNOSIS — E291 Testicular hypofunction: Secondary | ICD-10-CM | POA: Diagnosis not present

## 2017-09-17 DIAGNOSIS — R948 Abnormal results of function studies of other organs and systems: Secondary | ICD-10-CM | POA: Diagnosis not present

## 2017-09-17 DIAGNOSIS — D352 Benign neoplasm of pituitary gland: Secondary | ICD-10-CM | POA: Diagnosis not present

## 2017-09-17 DIAGNOSIS — R7989 Other specified abnormal findings of blood chemistry: Secondary | ICD-10-CM | POA: Diagnosis not present

## 2017-09-17 DIAGNOSIS — E038 Other specified hypothyroidism: Secondary | ICD-10-CM | POA: Diagnosis not present

## 2017-09-17 DIAGNOSIS — E2749 Other adrenocortical insufficiency: Secondary | ICD-10-CM | POA: Diagnosis not present

## 2017-11-07 ENCOUNTER — Encounter (INDEPENDENT_AMBULATORY_CARE_PROVIDER_SITE_OTHER): Payer: Medicare Other | Admitting: Ophthalmology

## 2017-11-07 DIAGNOSIS — I10 Essential (primary) hypertension: Secondary | ICD-10-CM | POA: Diagnosis not present

## 2017-11-07 DIAGNOSIS — H35033 Hypertensive retinopathy, bilateral: Secondary | ICD-10-CM | POA: Diagnosis not present

## 2017-11-07 DIAGNOSIS — H35343 Macular cyst, hole, or pseudohole, bilateral: Secondary | ICD-10-CM

## 2017-11-07 DIAGNOSIS — H43812 Vitreous degeneration, left eye: Secondary | ICD-10-CM | POA: Diagnosis not present

## 2017-11-07 DIAGNOSIS — H47213 Primary optic atrophy, bilateral: Secondary | ICD-10-CM

## 2017-11-20 DIAGNOSIS — E2749 Other adrenocortical insufficiency: Secondary | ICD-10-CM | POA: Diagnosis not present

## 2017-11-20 DIAGNOSIS — E038 Other specified hypothyroidism: Secondary | ICD-10-CM | POA: Diagnosis not present

## 2017-11-20 DIAGNOSIS — E291 Testicular hypofunction: Secondary | ICD-10-CM | POA: Diagnosis not present

## 2017-11-20 DIAGNOSIS — R3914 Feeling of incomplete bladder emptying: Secondary | ICD-10-CM | POA: Diagnosis not present

## 2017-11-20 DIAGNOSIS — E230 Hypopituitarism: Secondary | ICD-10-CM | POA: Diagnosis not present

## 2018-01-14 DIAGNOSIS — Z23 Encounter for immunization: Secondary | ICD-10-CM | POA: Diagnosis not present

## 2018-02-20 DIAGNOSIS — I10 Essential (primary) hypertension: Secondary | ICD-10-CM | POA: Diagnosis not present

## 2018-02-20 DIAGNOSIS — E271 Primary adrenocortical insufficiency: Secondary | ICD-10-CM | POA: Diagnosis not present

## 2018-02-20 DIAGNOSIS — Z79899 Other long term (current) drug therapy: Secondary | ICD-10-CM | POA: Diagnosis not present

## 2018-02-20 DIAGNOSIS — E2740 Unspecified adrenocortical insufficiency: Secondary | ICD-10-CM | POA: Diagnosis not present

## 2018-02-20 DIAGNOSIS — R112 Nausea with vomiting, unspecified: Secondary | ICD-10-CM | POA: Diagnosis not present

## 2018-02-20 DIAGNOSIS — K5730 Diverticulosis of large intestine without perforation or abscess without bleeding: Secondary | ICD-10-CM | POA: Diagnosis not present

## 2018-02-20 DIAGNOSIS — R109 Unspecified abdominal pain: Secondary | ICD-10-CM | POA: Diagnosis not present

## 2018-02-20 DIAGNOSIS — Z87891 Personal history of nicotine dependence: Secondary | ICD-10-CM | POA: Diagnosis not present

## 2018-02-20 DIAGNOSIS — E039 Hypothyroidism, unspecified: Secondary | ICD-10-CM | POA: Diagnosis present

## 2018-02-20 DIAGNOSIS — Z8619 Personal history of other infectious and parasitic diseases: Secondary | ICD-10-CM | POA: Diagnosis not present

## 2018-02-20 DIAGNOSIS — E860 Dehydration: Secondary | ICD-10-CM | POA: Diagnosis not present

## 2018-02-20 DIAGNOSIS — K56600 Partial intestinal obstruction, unspecified as to cause: Secondary | ICD-10-CM | POA: Diagnosis present

## 2018-02-20 DIAGNOSIS — E893 Postprocedural hypopituitarism: Secondary | ICD-10-CM | POA: Diagnosis not present

## 2018-02-20 DIAGNOSIS — K56609 Unspecified intestinal obstruction, unspecified as to partial versus complete obstruction: Secondary | ICD-10-CM | POA: Diagnosis not present

## 2018-02-20 DIAGNOSIS — J9811 Atelectasis: Secondary | ICD-10-CM | POA: Diagnosis not present

## 2018-02-20 DIAGNOSIS — Z7952 Long term (current) use of systemic steroids: Secondary | ICD-10-CM | POA: Diagnosis not present

## 2018-03-01 DIAGNOSIS — K760 Fatty (change of) liver, not elsewhere classified: Secondary | ICD-10-CM | POA: Diagnosis not present

## 2018-03-01 DIAGNOSIS — R1011 Right upper quadrant pain: Secondary | ICD-10-CM | POA: Diagnosis not present

## 2018-03-01 DIAGNOSIS — K810 Acute cholecystitis: Secondary | ICD-10-CM | POA: Diagnosis not present

## 2018-03-01 DIAGNOSIS — R1084 Generalized abdominal pain: Secondary | ICD-10-CM | POA: Diagnosis not present

## 2018-03-02 DIAGNOSIS — R4789 Other speech disturbances: Secondary | ICD-10-CM | POA: Diagnosis not present

## 2018-03-02 DIAGNOSIS — J9811 Atelectasis: Secondary | ICD-10-CM | POA: Diagnosis not present

## 2018-03-02 DIAGNOSIS — K56600 Partial intestinal obstruction, unspecified as to cause: Secondary | ICD-10-CM | POA: Diagnosis not present

## 2018-03-26 DEATH — deceased

## 2018-11-10 ENCOUNTER — Encounter (INDEPENDENT_AMBULATORY_CARE_PROVIDER_SITE_OTHER): Payer: Medicare Other | Admitting: Ophthalmology
